# Patient Record
Sex: Female | Born: 1935 | Race: White | Hispanic: No | Smoking: Never smoker
Health system: Southern US, Community
[De-identification: ages and names within clinical notes are randomized; demographics above are authoritative.]

## PROBLEM LIST (undated history)

## (undated) DIAGNOSIS — K5090 Crohn's disease, unspecified, without complications: Secondary | ICD-10-CM

## (undated) DIAGNOSIS — H409 Unspecified glaucoma: Secondary | ICD-10-CM

## (undated) DIAGNOSIS — I10 Essential (primary) hypertension: Secondary | ICD-10-CM

## (undated) DIAGNOSIS — E785 Hyperlipidemia, unspecified: Secondary | ICD-10-CM

## (undated) DIAGNOSIS — E559 Vitamin D deficiency, unspecified: Secondary | ICD-10-CM

## (undated) DIAGNOSIS — R7303 Prediabetes: Secondary | ICD-10-CM

## (undated) HISTORY — DX: Prediabetes: R73.03

## (undated) HISTORY — DX: Crohn's disease, unspecified, without complications: K50.90

## (undated) HISTORY — DX: Essential (primary) hypertension: I10

## (undated) HISTORY — DX: Vitamin D deficiency, unspecified: E55.9

## (undated) HISTORY — PX: TONSILLECTOMY AND ADENOIDECTOMY: SUR1326

## (undated) HISTORY — DX: Unspecified glaucoma: H40.9

## (undated) HISTORY — DX: Hyperlipidemia, unspecified: E78.5

---

## 1998-07-22 ENCOUNTER — Ambulatory Visit (HOSPITAL_COMMUNITY): Admission: RE | Admit: 1998-07-22 | Discharge: 1998-07-22 | Payer: Self-pay | Admitting: *Deleted

## 1998-09-22 ENCOUNTER — Ambulatory Visit (HOSPITAL_COMMUNITY): Admission: RE | Admit: 1998-09-22 | Discharge: 1998-09-22 | Payer: Self-pay | Admitting: *Deleted

## 2006-10-13 ENCOUNTER — Ambulatory Visit: Payer: Self-pay | Admitting: Cardiovascular Disease

## 2008-12-04 ENCOUNTER — Ambulatory Visit (HOSPITAL_COMMUNITY): Admission: RE | Admit: 2008-12-04 | Discharge: 2008-12-04 | Payer: Self-pay | Admitting: Internal Medicine

## 2011-04-23 NOTE — Letter (Signed)
October 13, 2006    Courtney Fuller, D.O.  [address]  [place], [place] [address]   RE:  Courtney, Fuller  MRN:  [id number]  /  DOB:  1915-02-26   Dear Dr. Elisabeth Most:   It was my pleasure to see Courtney Fuller at the [facility] this  morning. As you know, she is a delightful 77 year old woman who has been  remarkably healthy over her life, presenting today for an evaluation of an  abnormal EKG.   Courtney Fuller has a history of essential hypertension. She has no other  cardiovascular problems in her history. She is completely asymptomatic. She  specifically denies chest pain, dyspnea, palpitations, lightheadedness,  syncope, orthopnea, PND or edema. She recently underwent her yearly physical  examination and was found to be in good health. She did have an abnormal EKG  and was sent here for evaluation of that new issue.   Past medical history is pertinent for:  1. A remote tonsillectomy.  2. Essential hypertension, treated with Altace and atenolol.  3. Dyslipidemia, treated with Crestor.  4. Crohn's disease, on Asacol.   Current medications include aspirin 81 mg daily, triamterene/HCTZ 37.5/25 mg  daily, Altace 5 mg daily, Asacol 400 mg two twice daily, atenolol 50 mg  twice daily, Crestor 5 mg daily, Fosamax 70 mg weekly, timolol drops,  Xalatan drops, flax seed oil, glucosamine and chondroitin and cinnamon  daily.   ALLERGIES:  No known drug allergies.   SOCIAL HISTORY:  The patient is widowed. 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 She worked 46 hours this week. She does not  smoke cigarettes or drink alcohol. She occasionally drinks caffeine. She  exercises five days weekly as she walks for 25 minutes.   FAMILY HISTORY:  Her mother died at 105 of a heart attack; father died at 59  of a stroke. She has a brother who died at 63 of old age.   REVIEW OF SYSTEMS:  Complete 12-point review of systems was performed. It  was  negative, except as described above.   PHYSICAL EXAMINATION:  The patient is alert and oriented. She is a healthy-  appearing, elderly woman in no acute distress. Her weight is 147 pounds,  blood pressure is 128/84, heart rate is 66, respiratory rate is 12.  EYES: Sclera anicteric. Conjunctivae pink.  ENT: Moist oral mucosa with a normal oropharynx.  NECK: Normal carotid upstrokes without bruits. Jugulovenous pressure is  normal.  LUNGS:  Are clear bilaterally.  CARDIOVASCULAR: There is no RV lift. Heart is regular rate and rhythm  without murmurs or gallops. The apex is discrete.  ABDOMEN: Soft and nontender. No organomegaly. No abdominal bruits.  EXTREMITIES: No clubbing, cyanosis or edema. Peripheral pulses are 2+ and  equal throughout.  SKIN: Is warm and dry without rash.  NEUROLOGIC: Is grossly intact with 5/5 strength in the arms and legs  bilaterally.  EKG demonstrates normal sinus rhythm with a heart rate of 66. The PR  interval is 130 milliseconds.   The previous EKG also demonstrates normal sinus rhythm with a lot of  baseline artifact. The artifact mimics atrial fibrillation.   LABORATORY DATA:  Demonstrates normal hemoglobin with 14.0; platelet count  of 299,000; potassium is 5.4, creatinine is 1.2; BUN is 29; total  cholesterol is 220; triglycerides 178; LDL is 134; HDL is 50.   ASSESSMENT:  Courtney Fuller is an 77 year old woman with essential hypertension.  Her outside electrocardiogram was  difficult to interpret secondary to  baseline artifact. Our 12-lead electrocardiogram here demonstrates normal  sinus rhythm. Comparing the two electrocardiograms, the rhythm appears  unchanged. I do not think the patient has had any atrial-fibrillation as her  heart rate is unchanged and her P-wave morphology looks the same on both  studies. The baseline artifact from her previous electrocardiogram did mimic  atrial-fibrillation/flutter making it difficult to interpret. I do not   recommend any further cardiac studies at this point. I think the patient is  really doing remarkably well and has excellent control of her  hypertension and other cardiovascular risk factors. I would be happy to see  her back on a p.r.n. basis anytime.   Dr. Elisabeth Most, thanks once again for the opportunity to evaluate Courtney Fuller.  Please feel free to contact me at any time with questions regarding her  care.    Sincerely,      Veverly Fells. Excell Seltzer, MD  Electronically Signed    MDC/MedQ  DD: 10/13/2006  DT: 10/13/2006  Job #: [id number]

## 2013-12-18 DIAGNOSIS — H409 Unspecified glaucoma: Secondary | ICD-10-CM | POA: Insufficient documentation

## 2013-12-18 DIAGNOSIS — K5090 Crohn's disease, unspecified, without complications: Secondary | ICD-10-CM | POA: Insufficient documentation

## 2013-12-18 DIAGNOSIS — R7303 Prediabetes: Secondary | ICD-10-CM | POA: Insufficient documentation

## 2013-12-18 DIAGNOSIS — E559 Vitamin D deficiency, unspecified: Secondary | ICD-10-CM | POA: Insufficient documentation

## 2013-12-18 DIAGNOSIS — I10 Essential (primary) hypertension: Secondary | ICD-10-CM | POA: Insufficient documentation

## 2013-12-18 DIAGNOSIS — E785 Hyperlipidemia, unspecified: Secondary | ICD-10-CM | POA: Insufficient documentation

## 2013-12-19 ENCOUNTER — Encounter: Payer: Self-pay | Admitting: Physician Assistant

## 2013-12-19 ENCOUNTER — Ambulatory Visit (INDEPENDENT_AMBULATORY_CARE_PROVIDER_SITE_OTHER): Payer: Medicare HMO | Admitting: Physician Assistant

## 2013-12-19 VITALS — BP 132/82 | HR 60 | Temp 98.6°F | Resp 16 | Wt 156.0 lb

## 2013-12-19 DIAGNOSIS — K5090 Crohn's disease, unspecified, without complications: Secondary | ICD-10-CM

## 2013-12-19 DIAGNOSIS — E782 Mixed hyperlipidemia: Secondary | ICD-10-CM

## 2013-12-19 DIAGNOSIS — H409 Unspecified glaucoma: Secondary | ICD-10-CM

## 2013-12-19 DIAGNOSIS — E559 Vitamin D deficiency, unspecified: Secondary | ICD-10-CM

## 2013-12-19 DIAGNOSIS — R7309 Other abnormal glucose: Secondary | ICD-10-CM

## 2013-12-19 DIAGNOSIS — Z79899 Other long term (current) drug therapy: Secondary | ICD-10-CM

## 2013-12-19 DIAGNOSIS — I10 Essential (primary) hypertension: Secondary | ICD-10-CM

## 2013-12-19 DIAGNOSIS — E785 Hyperlipidemia, unspecified: Secondary | ICD-10-CM

## 2013-12-19 DIAGNOSIS — R7303 Prediabetes: Secondary | ICD-10-CM

## 2013-12-19 LAB — BASIC METABOLIC PANEL WITH GFR
BUN: 28 mg/dL — ABNORMAL HIGH (ref 6–23)
CALCIUM: 9.5 mg/dL (ref 8.4–10.5)
CO2: 32 mEq/L (ref 19–32)
Chloride: 101 mEq/L (ref 96–112)
Creat: 1.07 mg/dL (ref 0.50–1.10)
GFR, EST AFRICAN AMERICAN: 51 mL/min — AB
GFR, EST NON AFRICAN AMERICAN: 44 mL/min — AB
Glucose, Bld: 87 mg/dL (ref 70–99)
Potassium: 5.1 mEq/L (ref 3.5–5.3)
SODIUM: 140 meq/L (ref 135–145)

## 2013-12-19 LAB — HEPATIC FUNCTION PANEL
ALT: 11 U/L (ref 0–35)
AST: 18 U/L (ref 0–37)
Albumin: 3.8 g/dL (ref 3.5–5.2)
Alkaline Phosphatase: 50 U/L (ref 39–117)
BILIRUBIN DIRECT: 0.1 mg/dL (ref 0.0–0.3)
BILIRUBIN TOTAL: 0.4 mg/dL (ref 0.3–1.2)
Indirect Bilirubin: 0.3 mg/dL (ref 0.0–0.9)
Total Protein: 7.1 g/dL (ref 6.0–8.3)

## 2013-12-19 LAB — CBC WITH DIFFERENTIAL/PLATELET
BASOS PCT: 0 % (ref 0–1)
Basophils Absolute: 0 10*3/uL (ref 0.0–0.1)
EOS ABS: 0 10*3/uL (ref 0.0–0.7)
Eosinophils Relative: 0 % (ref 0–5)
HCT: 39.7 % (ref 36.0–46.0)
Hemoglobin: 13.3 g/dL (ref 12.0–15.0)
LYMPHS ABS: 1.3 10*3/uL (ref 0.7–4.0)
Lymphocytes Relative: 14 % (ref 12–46)
MCH: 27.8 pg (ref 26.0–34.0)
MCHC: 33.5 g/dL (ref 30.0–36.0)
MCV: 83.1 fL (ref 78.0–100.0)
Monocytes Absolute: 0.8 10*3/uL (ref 0.1–1.0)
Monocytes Relative: 9 % (ref 3–12)
NEUTROS PCT: 77 % (ref 43–77)
Neutro Abs: 7.1 10*3/uL (ref 1.7–7.7)
PLATELETS: 313 10*3/uL (ref 150–400)
RBC: 4.78 MIL/uL (ref 3.87–5.11)
RDW: 14.9 % (ref 11.5–15.5)
WBC: 9.2 10*3/uL (ref 4.0–10.5)

## 2013-12-19 LAB — LIPID PANEL
CHOL/HDL RATIO: 4.8 ratio
CHOLESTEROL: 196 mg/dL (ref 0–200)
HDL: 41 mg/dL (ref >39)
LDL Cholesterol: 124 mg/dL — ABNORMAL HIGH (ref 0–99)
TRIGLYCERIDES: 155 mg/dL — AB (ref <150)
VLDL: 31 mg/dL (ref 0–40)

## 2013-12-19 LAB — HEMOGLOBIN A1C
Hgb A1c MFr Bld: 5.5 % (ref <5.7)
MEAN PLASMA GLUCOSE: 111 mg/dL (ref <117)

## 2013-12-19 LAB — MAGNESIUM: Magnesium: 1.9 mg/dL (ref 1.5–2.5)

## 2013-12-19 MED ORDER — ATENOLOL 50 MG PO TABS: 50.0000 mg | ORAL_TABLET | Freq: Three times a day (TID) | ORAL | Status: DC

## 2013-12-19 MED ORDER — MESALAMINE 1.2 G PO TBEC: 1.2000 g | DELAYED_RELEASE_TABLET | Freq: Two times a day (BID) | ORAL | Status: DC

## 2013-12-19 NOTE — Progress Notes (Signed)
Complete Physical HPI Patient presents for complete physical.   Patient's blood pressure has been controlled at home. Patient denies chest pain, shortness of breath, dizziness. BP: 132/82 mmHg  Patient's cholesterol is diet controlled. The patient's cholesterol last visit was LDL 120.  Her GFR was 39 last visit. She has been unable to sleep and has been taking aleve PM which is helping.  The patient has been working on diet and exercise for prediabetes, denies changes in vision, polys, and paresthesias. 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 Katy Fitch q 6 months and Dr. Zadie Rhine q 6 months.  She has crohn's disease and is on Lialda for this twice daily. Her last colonoscopy was in 2004 and she was in remission. Occasionally she has constipation uses supp.   Current Medications:  Current Outpatient Prescriptions on File Prior to Visit  Medication Sig Dispense Refill  . aspirin 81 MG chewable tablet Chew by mouth daily.      Marland Kitchen atenolol (TENORMIN) 50 MG tablet Take 50 mg by mouth 3 (three) times daily.      . Cholecalciferol 4000 UNITS CAPS Take 4,000 Int'l Units by mouth daily.      . LUTEIN PO Take by mouth daily.      . mesalamine (LIALDA) 1.2 G EC tablet Take 1.2 g by mouth 2 (two) times daily.      . Red Yeast Rice 600 MG CAPS Take by mouth daily.       No current facility-administered medications on file prior to visit.   Health Maintenance:  Tetanus: 2014 Pneumovax: 2004 Flu vaccine: 09/2013 Zostavax: 2007 Pap: declines MGM: declines DEXA: declines Colonoscopy: 2004- never again EGD: declines   Allergies:  Allergies  Allergen Reactions  . Ace Inhibitors    Medical History:  Past Medical History  Diagnosis Date  . Hyperlipidemia   . Hypertension   . Prediabetes   . Vitamin D deficiency   . Glaucoma   . Crohn's disease    Surgical History:  Past Surgical History  Procedure Laterality Date  . Tonsillectomy and  adenoidectomy     Family History: No family history on file. Social History:  History   Social History  . Marital Status: Widowed    Spouse Name: N/A    Number of Children: N/A  . Years of Education: N/A   Occupational History  . Not on file.   Social History Main Topics  . Smoking status: Never Smoker   . Smokeless tobacco: Not on file  . Alcohol Use: Not on file  . Drug Use: Not on file  . Sexual Activity: Not on file   Other Topics Concern  . Not on file   Social History Narrative  . No narrative on file   ROS Constitutional: Denies weight loss/gain, headaches, insomnia, fatigue, night sweats, and change in appetite. Eyes: Denies redness, blurred vision, diplopia, discharge, itchy, watery eyes.  ENT: Denies discharge, congestion, post nasal drip, sore throat, earache, hearing loss, dental pain, Tinnitus, Vertigo, Sinus pain, snoring.  Cardio: Denies chest pain, palpitations, irregular heartbeat, dyspnea, diaphoresis, orthopnea, PND, claudication, edema Respiratory: denies cough, dyspnea, pleurisy, hoarseness, wheezing.  Gastrointestinal: Olevia Perches) Denies dysphagia, heartburn, pain, cramps, nausea, vomiting, bloating, diarrhea, constipation, hematemesis, melena, hematochezia, hemorrhoids Genitourinary: Denies dysuria, frequency, urgency, nocturia, hesitancy, discharge, hematuria, flank pain Breast: Denies Breast lumps, nipple discharge, bleeding.  Musculoskeletal: Denies arthralgia, myalgia, stiffness, Jt. Swelling, pain, Skin: Denies pruritis, rash, hives,  acne, eczema, changing in skin lesion Neuro: Denies Weakness, tremor, incoordination, spasms, paresthesia, pain Psychiatric: Denies confusion, memory loss, sensory loss Endocrine: Denies change in weight, skin, hair change, nocturia, and paresthesia, Diabetic Denies Polys, visual blurring, hyper /hypo glycemic episodes.  Heme/Lymph: Denies Excessive bleeding, bruising, enlarged lymph nodes  Physical Exam: There is no  height on file to calculate BMI. Filed Vitals:   12/19/13 1406  BP: 132/82  Pulse: 60  Temp: 98.6 F (37 C)  Resp: 16   General Appearance: Well nourished, in no apparent distress. Eyes: PERRLA, EOMs, conjunctiva no swelling or erythema, normal fundi and vessels. Sinuses: No Frontal/maxillary tenderness ENT/Mouth: Ext aud canals clear, normal light reflex with TMs without erythema, bulging.  Good dentition. No erythema, swelling, or exudate on post pharynx. Tonsils not swollen or erythematous. Hearing normal.  Neck: Supple, thyroid normal. No bruits Respiratory: Respiratory effort normal, BS equal bilaterally without rales, rhonchi, wheezing or stridor. Cardio: RRR without murmurs, rubs or gallops. Brisk peripheral pulses with mild bilateral edema.  Chest: symmetric, with normal excursions and percussion. Breasts: defer Abdomen: Soft, +BS. Non tender, no guarding, rebound, hernias, masses, or organomegaly. .  Lymphatics: Non tender without lymphadenopathy.  Genitourinary: defer Musculoskeletal: Full ROM all peripheral extremities,5/5 strength, and normal gait. Skin: Warm, dry without rashes, lesions, ecchymosis. On front of forehead, she states she bumps her head, erythematous scaly area.  Neuro: Cranial nerves intact, reflexes equal bilaterally. Normal muscle tone, no cerebellar symptoms.  Psych: Awake and oriented X 3, normal affect, Insight and Judgment appropriate.   EKG: declines  Assessment and Plan: Hyperlipidemia- check level  Hypertension- continue atenolol  Prediabetes- check level  Vitamin D deficiency- continue meds  Glaucoma- continue to follow eye doctors.   Crohn's disease- decrease to once daily since in maintenance.   She is a very goo 80 year old, cognition good, and she does not want anything invasive or any extreme measures.  Due to advanced age she would also prefer if she only came in once a year unless there is something wrong.   Discussed med's effects  and SE's. Screening labs and tests as requested with regular follow-up as recommended.   Vicie Mutters 2:27 PM

## 2013-12-19 NOTE — Patient Instructions (Addendum)
Rather than do aleve night time please get acetaminophen PM/night time due to your kidney function. You can take 1-2 at night. If this does not help you please call the office.   Constipation, Adult Constipation is when a person has fewer than 3 bowel movements a week; has difficulty having a bowel movement; or has stools that are dry, hard, or larger than normal. As people grow older, constipation is more common. If you try to fix constipation with medicines that make you have a bowel movement (laxatives), the problem may get worse. Long-term laxative use may cause the muscles of the colon to become weak. A low-fiber diet, not taking in enough fluids, and taking certain medicines may make constipation worse. CAUSES   Certain medicines, such as antidepressants, pain medicine, iron supplements, antacids, and water pills.   Certain diseases, such as diabetes, irritable bowel syndrome (IBS), thyroid disease, or depression.   Not drinking enough water.   Not eating enough fiber-rich foods.   Stress or travel.  Lack of physical activity or exercise.  Not going to the restroom when there is the urge to have a bowel movement.  Ignoring the urge to have a bowel movement.  Using laxatives too much. SYMPTOMS   Having fewer than 3 bowel movements a week.   Straining to have a bowel movement.   Having hard, dry, or larger than normal stools.   Feeling full or bloated.   Pain in the lower abdomen.  Not feeling relief after having a bowel movement. DIAGNOSIS  Your caregiver will take a medical history and perform a physical exam. Further testing may be done for severe constipation. Some tests may include:   A barium enema X-ray to examine your rectum, colon, and sometimes, your small intestine.  A sigmoidoscopy to examine your lower colon.  A colonoscopy to examine your entire colon. TREATMENT  Treatment will depend on the severity of your constipation and what is causing it.  Some dietary treatments include drinking more fluids and eating more fiber-rich foods. Lifestyle treatments may include regular exercise. If these diet and lifestyle recommendations do not help, your caregiver may recommend taking over-the-counter laxative medicines to help you have bowel movements. Prescription medicines may be prescribed if over-the-counter medicines do not work.  HOME CARE INSTRUCTIONS   Increase dietary fiber in your diet, such as fruits, vegetables, whole grains, and beans. Limit high-fat and processed sugars in your diet, such as [place] fries, hamburgers, cookies, candies, and soda.   You can add magnesium 250mg  one- two times daily.   A fiber supplement may be added to your diet if you cannot get enough fiber from foods.   Drink enough fluids to keep your urine clear or pale yellow.   Exercise regularly or as directed by your caregiver.   Go to the restroom when you have the urge to go. Do not hold it.  Only take medicines as directed by your caregiver. Do not take other medicines for constipation without talking to your caregiver first. [place] IF:   You have bright red blood in your stool.   Your constipation lasts for more than 4 days or gets worse.   You have abdominal or rectal pain.   You have thin, pencil-like stools.  You have unexplained weight loss. MAKE SURE YOU:   Understand these instructions.  Will watch your condition.  Will get help right away if you are not doing well or get worse. Document Released: 08/20/2004 Document Revised: 02/14/2012  Document Reviewed: 09/03/2013 [facility] Patient Information 2014 [place].

## 2013-12-20 LAB — URINALYSIS, ROUTINE W REFLEX MICROSCOPIC
BILIRUBIN URINE: NEGATIVE
Glucose, UA: NEGATIVE mg/dL
Hgb urine dipstick: NEGATIVE
KETONES UR: NEGATIVE mg/dL
Leukocytes, UA: NEGATIVE
NITRITE: NEGATIVE
PH: 6 (ref 5.0–8.0)
Protein, ur: NEGATIVE mg/dL
SPECIFIC GRAVITY, URINE: 1.014 (ref 1.005–1.030)
Urobilinogen, UA: 0.2 mg/dL (ref 0.0–1.0)

## 2013-12-20 LAB — MICROALBUMIN / CREATININE URINE RATIO
CREATININE, URINE: 88.2 mg/dL
MICROALB/CREAT RATIO: 22 mg/g (ref 0.0–30.0)
Microalb, Ur: 1.94 mg/dL — ABNORMAL HIGH (ref 0.00–1.89)

## 2013-12-20 LAB — TSH: TSH: 4.097 u[IU]/mL (ref 0.350–4.500)

## 2014-07-09 ENCOUNTER — Other Ambulatory Visit: Payer: Self-pay | Admitting: Physician Assistant

## 2014-12-19 ENCOUNTER — Encounter: Payer: Self-pay | Admitting: Physician Assistant

## 2014-12-25 ENCOUNTER — Encounter: Payer: Self-pay | Admitting: Physician Assistant

## 2014-12-25 ENCOUNTER — Ambulatory Visit (INDEPENDENT_AMBULATORY_CARE_PROVIDER_SITE_OTHER): Payer: Medicare HMO | Admitting: Physician Assistant

## 2014-12-25 VITALS — BP 142/90 | HR 68 | Temp 98.1°F | Resp 16 | Ht 63.0 in | Wt 149.0 lb

## 2014-12-25 DIAGNOSIS — Z79899 Other long term (current) drug therapy: Secondary | ICD-10-CM | POA: Diagnosis not present

## 2014-12-25 DIAGNOSIS — N183 Chronic kidney disease, stage 3 unspecified: Secondary | ICD-10-CM | POA: Insufficient documentation

## 2014-12-25 DIAGNOSIS — E785 Hyperlipidemia, unspecified: Secondary | ICD-10-CM

## 2014-12-25 DIAGNOSIS — R7309 Other abnormal glucose: Secondary | ICD-10-CM

## 2014-12-25 DIAGNOSIS — H3530 Unspecified macular degeneration: Secondary | ICD-10-CM

## 2014-12-25 DIAGNOSIS — Z0001 Encounter for general adult medical examination with abnormal findings: Secondary | ICD-10-CM | POA: Diagnosis not present

## 2014-12-25 DIAGNOSIS — R6889 Other general symptoms and signs: Secondary | ICD-10-CM

## 2014-12-25 DIAGNOSIS — H409 Unspecified glaucoma: Secondary | ICD-10-CM

## 2014-12-25 DIAGNOSIS — I10 Essential (primary) hypertension: Secondary | ICD-10-CM | POA: Diagnosis not present

## 2014-12-25 DIAGNOSIS — K5090 Crohn's disease, unspecified, without complications: Secondary | ICD-10-CM

## 2014-12-25 DIAGNOSIS — R7303 Prediabetes: Secondary | ICD-10-CM

## 2014-12-25 DIAGNOSIS — E559 Vitamin D deficiency, unspecified: Secondary | ICD-10-CM

## 2014-12-25 LAB — CBC WITH DIFFERENTIAL/PLATELET
Basophils Absolute: 0 10*3/uL (ref 0.0–0.1)
Basophils Relative: 0 % (ref 0–1)
Eosinophils Absolute: 0 10*3/uL (ref 0.0–0.7)
Eosinophils Relative: 0 % (ref 0–5)
HEMATOCRIT: 42 % (ref 36.0–46.0)
Hemoglobin: 13.7 g/dL (ref 12.0–15.0)
LYMPHS ABS: 1.1 10*3/uL (ref 0.7–4.0)
LYMPHS PCT: 12 % (ref 12–46)
MCH: 27.8 pg (ref 26.0–34.0)
MCHC: 32.6 g/dL (ref 30.0–36.0)
MCV: 85.2 fL (ref 78.0–100.0)
MONOS PCT: 11 % (ref 3–12)
MPV: 10.2 fL (ref 8.6–12.4)
Monocytes Absolute: 1 10*3/uL (ref 0.1–1.0)
NEUTROS ABS: 7.2 10*3/uL (ref 1.7–7.7)
NEUTROS PCT: 77 % (ref 43–77)
Platelets: 345 10*3/uL (ref 150–400)
RBC: 4.93 MIL/uL (ref 3.87–5.11)
RDW: 14.8 % (ref 11.5–15.5)
WBC: 9.3 10*3/uL (ref 4.0–10.5)

## 2014-12-25 LAB — HEPATIC FUNCTION PANEL
ALBUMIN: 3.9 g/dL (ref 3.5–5.2)
ALT: 13 U/L (ref 0–35)
AST: 17 U/L (ref 0–37)
Alkaline Phosphatase: 58 U/L (ref 39–117)
BILIRUBIN TOTAL: 0.7 mg/dL (ref 0.2–1.2)
Bilirubin, Direct: 0.1 mg/dL (ref 0.0–0.3)
Indirect Bilirubin: 0.6 mg/dL (ref 0.2–1.2)
TOTAL PROTEIN: 7.7 g/dL (ref 6.0–8.3)

## 2014-12-25 LAB — BASIC METABOLIC PANEL WITH GFR
BUN: 27 mg/dL — AB (ref 6–23)
CO2: 30 mEq/L (ref 19–32)
CREATININE: 1.13 mg/dL — AB (ref 0.50–1.10)
Calcium: 9.4 mg/dL (ref 8.4–10.5)
Chloride: 101 mEq/L (ref 96–112)
GFR, EST AFRICAN AMERICAN: 47 mL/min — AB
GFR, EST NON AFRICAN AMERICAN: 41 mL/min — AB
Glucose, Bld: 99 mg/dL (ref 70–99)
Potassium: 4.9 mEq/L (ref 3.5–5.3)
Sodium: 140 mEq/L (ref 135–145)

## 2014-12-25 LAB — LIPID PANEL
CHOL/HDL RATIO: 4.8 ratio
CHOLESTEROL: 203 mg/dL — AB (ref 0–200)
HDL: 42 mg/dL (ref >39)
LDL Cholesterol: 127 mg/dL — ABNORMAL HIGH (ref 0–99)
Triglycerides: 170 mg/dL — ABNORMAL HIGH (ref <150)
VLDL: 34 mg/dL (ref 0–40)

## 2014-12-25 LAB — TSH: TSH: 5.026 u[IU]/mL — ABNORMAL HIGH (ref 0.350–4.500)

## 2014-12-25 LAB — MAGNESIUM: Magnesium: 2.1 mg/dL (ref 1.5–2.5)

## 2014-12-25 MED ORDER — MESALAMINE 1.2 G PO TBEC: 1.2000 g | DELAYED_RELEASE_TABLET | Freq: Two times a day (BID) | ORAL | Status: DC

## 2014-12-25 MED ORDER — ATENOLOL 50 MG PO TABS: ORAL_TABLET | ORAL | Status: DC

## 2014-12-25 NOTE — Patient Instructions (Addendum)
-  Please take Tylenol or Aleve for pain as needed . -You can take tylenol (500mg ) or tylenol arthritis (650mg ).  The max you can take of tylenol a day is 3000mg  daily, this is a max of 6 pills a day of the regular tyelnol (500mg ) or a max of 4 a day of the tylenol arthritis (650mg ) as long as no other medications you are taking contain tylenol.   -Aleve/Naproxen Sodium- can take 1-2 in the morning  Take with food to avoid ulcers. Does affect your kidney function so use sparingly.   Use a dropper to put olive oil or canola oil in the effected ear- 2-3 times a week. Let it soak for 20-30 min then you can take a shower or use a baby bulb with warm water to wash out the ear wax.  Do not use Qtips  Tumeric with pepper extract works well for joint pain, try this rather than the other medication 1 or 2 pills a day at the most.

## 2014-12-25 NOTE — Progress Notes (Signed)
MEDICARE ANNUAL WELLNESS VISIT AND CPE  Assessment:   1. Essential hypertension - atenolol (TENORMIN) 50 MG tablet; TAKE 1 TABLET 3 (THREE) TIMES DAILY.  Dispense: 270 tablet; Refill: 2 - CBC with Differential - BASIC METABOLIC PANEL WITH GFR - Hepatic function panel - TSH - Urinalysis, Routine w reflex microscopic - Microalbumin / creatinine urine ratio  2. Crohn's disease, without complications - mesalamine (LIALDA) 1.2 G EC tablet; Take 1 tablet (1.2 g total) by mouth 2 (two) times daily.  Dispense: 180 tablet; Refill: 2  3. Prediabetes - HM DIABETES FOOT EXAM  4. Hyperlipidemia -continue medications, check lipids, decrease fatty foods, increase activity.  - Lipid panel  5. Vitamin D deficiency Continue supplement  6. Glaucoma Continue follow up  7. Macular degeneration Continue follow up  8. CKD (chronic kidney disease) stage 3, GFR 30-59 ml/min Increase fluids, avoid NSAIDS/take sparingly, monitor sugars, will monitor  9. Medication management - Magnesium   Plan:   During the course of the visit the patient was educated and counseled about appropriate screening and preventive services including:    Pneumococcal vaccine   Influenza vaccine  Td vaccine  Screening electrocardiogram  Bone densitometry screening  Colorectal cancer screening  Diabetes screening  Glaucoma screening  Nutrition counseling   Advanced directives: requested  Screening recommendations, referrals: Vaccinations: Please see documentation below and orders this visit.  Nutrition assessed and recommended  Colonoscopy declined Recommended yearly ophthalmology/optometry visit for glaucoma screening and checkup Recommended yearly dental visit for hygiene and checkup Advanced directives - requested  Conditions/risks identified: BMI: Discussed weight loss, diet, and increase physical activity.  Increase physical activity: AHA recommends 150 minutes of physical activity a  week.  Medications reviewed Diabetes is at goal, ACE/ARB therapy: No, Reason not on Ace Inhibitor/ARB therapy:  preDM Urinary Incontinence is not an issue: discussed non pharmacology and pharmacology options.  Fall risk: moderate- discussed PT, home fall assessment, medications.    Subjective:  Courtney Fuller is a 81 y.o. female who presents for Medicare Annual Wellness Visit and complete physical.  Date of last medicare wellness visit is unknown.   Her blood pressure has been controlled at home, today their BP is BP: (!) 142/90 mmHg She does not workout. She denies chest pain, shortness of breath, dizziness.  She is not on cholesterol medication and denies myalgias. Her cholesterol is at goal. The cholesterol last visit was:   Lab Results  Component Value Date   CHOL 196 12/19/2013   HDL 41 12/19/2013   LDLCALC 124* 12/19/2013   TRIG 155* 12/19/2013   CHOLHDL 4.8 12/19/2013   She has been working on diet and exercise for prediabetes, and denies paresthesia of the feet, polydipsia, polyuria and visual disturbances. A1C was 6.0 in the 2014. Last A1C in the office was:  Lab Results  Component Value Date   HGBA1C 5.5 12/19/2013  Patient is on Vitamin D supplement.   She has CKD, last GFR 44.  She follows with Dr. Katy Fitch and Dr. Zadie Rhine for Mac [name] and [name]- started to get injections with Dr. Zadie Rhine in May of last year, she goes every 6-8 weeks for an injection. So she is not driving anymore.  She has crohn's disease and is on lialda for this and has done well on this for many years and would like to stay on this.  No aches and pain except her feet, she will use aleve for this occ.  Currently lives at [facility] and she would like to  be DNR.    Names of Other Physician/Practitioners you currently use: 1. [facility] here for primary care 2. Dr. Katy Fitch, eye doctor, q 6 months 3. Dr. Zadie Rhine q 6 months [name], MD as PCP  - General (Internal Medicine)   Medication Review: Current Outpatient Prescriptions on File Prior to Visit  Medication Sig Dispense Refill  . aspirin 81 MG chewable tablet Chew by mouth daily.    Marland Kitchen atenolol (TENORMIN) 50 MG tablet TAKE 1 TABLET 3 (THREE) TIMES DAILY. 270 tablet 2  . Cholecalciferol 4000 UNITS CAPS Take 4,000 Int'l Units by mouth daily.    Marland Kitchen LIALDA 1.2 G EC tablet TAKE 1 TABLET  BY MOUTH 2 (TWO) TIMES DAILY. 180 tablet 2  . LUTEIN PO Take by mouth daily.    . Red Yeast Rice 600 MG CAPS Take by mouth daily.     No current facility-administered medications on file prior to visit.    Current Problems (verified) Patient Active Problem List   Diagnosis Date Noted  . Hyperlipidemia   . Hypertension   . Prediabetes   . Vitamin D deficiency   . Glaucoma   . Crohn's disease    Screening Tests Immunization History  Administered Date(s) Administered  . Influenza-Unspecified 09/18/2013  . Pneumococcal Polysaccharide-23 09/04/2003  . Tdap 12/13/2012  . Zoster 09/28/2006   Preventative care: Tetanus: 2014 declines another Pneumovax: 2004 declines another Prevnar 13: declines Flu vaccine: 09/2013 Zostavax: 2007 Pap: declines MGM: declines DEXA: declines Colonoscopy: 2004- declines another due to age EGD: declines  CXR: 2009    Medication List       This list is accurate as of: 12/25/14  2:16 PM.  Always use your most recent med list.               aspirin 81 MG chewable tablet  Chew by mouth daily.     atenolol 50 MG tablet  Commonly known as:  TENORMIN  TAKE 1 TABLET 3 (THREE) TIMES DAILY.     Cholecalciferol 4000 UNITS Caps  Take 4,000 Int'l Units by mouth daily.     LIALDA 1.2 G EC tablet  Generic drug:  mesalamine  TAKE 1 TABLET  BY MOUTH 2 (TWO) TIMES DAILY.     LUTEIN PO  Take by mouth daily.     Red Yeast Rice 600 MG Caps  Take by mouth daily.        Past Surgical History  Procedure Laterality Date  . Tonsillectomy and  adenoidectomy     No family history on file.  History reviewed: allergies, current medications, past family history, past medical history, past social history, past surgical history and problem list   Risk Factors: Osteoporosis/FallRisk: postmenopausal estrogen deficiency and dietary calcium and/or vitamin D deficiency In the past year have you fallen or had a near fall?:No History of fracture in the past year: no  Tobacco History  Substance Use Topics  . Smoking status: Never Smoker   . Smokeless tobacco: Not on file  . Alcohol Use: Not on file   She does not smoke.  Patient is not a former smoker. Are there smokers in your home (other than you)?  No  Alcohol Current alcohol use: none  Caffeine Current caffeine use: denies use  Exercise Current exercise: none  Nutrition/Diet Current diet: in general, a "healthy" diet    Cardiac risk factors: advanced age (older than 89 for men, 56 for women), dyslipidemia and hypertension.  Depression Screen (Note: if answer to either of the following is "Yes", a more complete depression screening is indicated)   Q1: Over the past two weeks, have you felt down, depressed or hopeless? No  Q2: Over the past two weeks, have you felt little interest or pleasure in doing things? No  Have you lost interest or pleasure in daily life? No  Do you often feel hopeless? No  Do you cry easily over simple problems? No  Activities of Daily Living In your present state of health, do you have any difficulty performing the following activities?:  Driving? Yes Managing money?  No Feeding yourself? No Getting from bed to chair? No Climbing a flight of stairs? No Preparing food and eating?: No Bathing or showering? No Getting dressed: No Getting to the toilet? No Using the toilet:No Moving around from place to place: No In the past year have you fallen or had a near fall?:No   Are you sexually active?  No  Do you have more than one partner?   No  Vision Difficulties: Yes  Hearing Difficulties: No Do you often ask people to speak up or repeat themselves? No Do you experience ringing or noises in your ears? No Do you have difficulty understanding soft or whispered voices? No  Cognition  Do you feel that you have a problem with memory?No  Do you often misplace items? No  Do you feel safe at home?  Yes  Advanced directives Does patient have a [place]? Yes Does patient have a Living Will? Yes   Objective:     Blood pressure 142/90, pulse 68, temperature 98.1 F (36.7 C), resp. rate 16, weight 149 lb (67.586 kg). There is no height on file to calculate BMI.  General appearance: alert, no distress, WD/WN, female Cognitive Testing  Alert? Yes  Normal Appearance?Yes  Oriented to person? Yes  Place? Yes   Time? Yes  Recall of three objects?  Yes  Can perform simple calculations? Yes  Displays appropriate judgment?Yes  Can read the correct time from a watch face?Yes  HEENT: normocephalic, sclerae anicteric, TMs pearly, nares patent, no discharge or erythema, pharynx normal Oral cavity: MMM, no lesions Neck: supple, no lymphadenopathy, no thyromegaly, no masses Heart: RRR, normal S1, S2, no murmurs Lungs: CTA bilaterally, no wheezes, rhonchi, or rales Abdomen: +bs, soft, non tender, non distended, no masses, no hepatomegaly, no splenomegaly Musculoskeletal: nontender, no swelling, no obvious deformity Extremities: no edema, no cyanosis, no clubbing Pulses: 2+ symmetric, upper and lower extremities, normal cap refill Neurological: alert, oriented x 3, CN2-12 intact, strength normal upper extremities and lower extremities, sensation normal throughout, DTRs 2+ throughout, no cerebellar signs, gait normal Psychiatric: normal affect, behavior normal, pleasant   Medicare Attestation I have personally reviewed: The patient's medical and social history Their use of alcohol, tobacco or illicit  drugs Their current medications and supplements The patient's functional ability including ADLs,fall risks, home safety risks, cognitive, and hearing and visual impairment Diet and physical activities Evidence for depression or mood disorders  The patient's weight, height, BMI, and visual acuity have been recorded in the chart.  I have made referrals, counseling, and provided education to the patient based on review of the above and I have provided the patient with a written personalized care plan for preventive services.     Vicie Mutters, PA-C   12/25/2014

## 2014-12-26 LAB — URINALYSIS, ROUTINE W REFLEX MICROSCOPIC
BILIRUBIN URINE: NEGATIVE
Glucose, UA: NEGATIVE mg/dL
HGB URINE DIPSTICK: NEGATIVE
KETONES UR: NEGATIVE mg/dL
Leukocytes, UA: NEGATIVE
NITRITE: NEGATIVE
Protein, ur: 30 mg/dL — AB
Specific Gravity, Urine: 1.02 (ref 1.005–1.030)
UROBILINOGEN UA: 0.2 mg/dL (ref 0.0–1.0)
pH: 7 (ref 5.0–8.0)

## 2014-12-26 LAB — MICROALBUMIN / CREATININE URINE RATIO
CREATININE, URINE: 132.4 mg/dL
MICROALB UR: 7.2 mg/dL — AB (ref <2.0)
MICROALB/CREAT RATIO: 54.4 mg/g — AB (ref 0.0–30.0)

## 2014-12-26 LAB — URINALYSIS, MICROSCOPIC ONLY
Bacteria, UA: NONE SEEN
Casts: NONE SEEN
Crystals: NONE SEEN

## 2015-01-02 ENCOUNTER — Other Ambulatory Visit: Payer: Self-pay

## 2015-01-02 MED ORDER — MESALAMINE 800 MG PO TBEC: 800.0000 mg | DELAYED_RELEASE_TABLET | Freq: Two times a day (BID) | ORAL | Status: DC

## 2015-01-07 ENCOUNTER — Ambulatory Visit (INDEPENDENT_AMBULATORY_CARE_PROVIDER_SITE_OTHER): Payer: Medicare HMO | Admitting: Internal Medicine

## 2015-01-07 ENCOUNTER — Encounter: Payer: Self-pay | Admitting: Internal Medicine

## 2015-01-07 VITALS — BP 134/96 | HR 64 | Temp 98.1°F | Resp 16 | Ht 63.0 in | Wt 149.0 lb

## 2015-01-07 DIAGNOSIS — J0410 Acute tracheitis without obstruction: Secondary | ICD-10-CM

## 2015-01-07 DIAGNOSIS — J4521 Mild intermittent asthma with (acute) exacerbation: Secondary | ICD-10-CM

## 2015-01-07 MED ORDER — PREDNISONE 20 MG PO TABS: ORAL_TABLET | ORAL | Status: DC

## 2015-01-07 MED ORDER — BENZONATATE 200 MG PO CAPS: 200.0000 mg | ORAL_CAPSULE | Freq: Three times a day (TID) | ORAL | Status: DC | PRN

## 2015-01-07 MED ORDER — AZITHROMYCIN 250 MG PO TABS: ORAL_TABLET | ORAL | Status: DC

## 2015-01-07 NOTE — Progress Notes (Signed)
   Subjective:    Patient ID: Courtney Fuller, female    DOB: Feb 11, 1915, 81 y.o.   MRN: [id number]  HPI Very nice 81 y.o. WWF from [place] presenting with a 1 week hx/o a a non productive , but wheezy cough. Denies fever/chills/dyspnea.   Medication Sig  . aspirin 81 MG chewable tablet Chew by mouth daily.  Marland Kitchen atenolol (TENORMIN) 50 MG tablet TAKE 1 TABLET 3 (THREE) TIMES DAILY.  Marland Kitchen Cholecalciferol 4000 UNITS CAPS Take 4,000 Int'l Units by mouth daily.  . LUTEIN PO Take by mouth daily.  . Mesalamine 800 MG TBEC Take 1 tablet (800 mg total) by mouth 2 (two) times daily.  . Red Yeast Rice 600 MG CAPS Take by mouth daily.   Allergies  Allergen Reactions  . Ace Inhibitors    Past Medical History  Diagnosis Date  . Hyperlipidemia   . Hypertension   . Prediabetes   . Vitamin D deficiency   . Glaucoma   . Crohn's disease    Review of Systems    Objective:   Physical Exam  BP 134/96   Pulse 64  Temp 98.1 F   Resp 16  Ht 5\' 3"    Wt 149 lb      BMI 26.40    Congested wheezy cough - no respiratory distress  HEENT - Eac's patent. TM's Nl. EOM's full. PERRLA. NasoOroPharynx clear. Neck - supple. Nl Thyroid. Carotids 2+ & No bruits, nodes, JVD Chest - Clear equal BS w/ scattered  Rales and exp wheezes, but no rhonchi. Cor - Nl HS. RRR w/o sig MGR. PP 1(+). No edema. MS- FROM w/o deformities. Muscle power, tone and bulk Nl. Gait Nl. Neuro - No obvious Cr N abnormalities. Sensory, motor and Cerebellar functions appear Nl w/o focal abnormalities. Psyche - Mental status normal & appropriate.  No delusions, ideations or obvious mood abnormalities.  O2 Sat measured 96-98% at rest.    Assessment & Plan:   1. Tracheitis   2. Asthma with acute exacerbation, mild intermittent   - Rx Zpak & 1 rf, Prednisone 20 mg #20 - pulse/taper & Tessalon 200 mg # 30 x 1 rf

## 2015-01-14 ENCOUNTER — Other Ambulatory Visit: Payer: Self-pay | Admitting: *Deleted

## 2015-01-14 DIAGNOSIS — I10 Essential (primary) hypertension: Secondary | ICD-10-CM

## 2015-01-14 MED ORDER — ATENOLOL 50 MG PO TABS: ORAL_TABLET | ORAL | Status: DC

## 2015-01-15 ENCOUNTER — Telehealth: Payer: Self-pay

## 2015-01-15 NOTE — Telephone Encounter (Signed)
Spoke with Oretha Milch, patients relative and care giver. Patients insurance has denied Asacol and she does not want to continue on the Lialda she states that it does not help her. She was given Pentasa samples at last visit , per [facility], patient seems to be responding to the medication and states she feels better on this medication. Advised Sheri that I placed more samples for her at front desk and that we may at some point have to try one of the generic alternatives per insurance company. Balsalazide Disodium Capsule, SulfaSalazine DR tab, or SulfaSalazine Tab

## 2015-01-29 ENCOUNTER — Other Ambulatory Visit: Payer: Self-pay

## 2015-01-29 MED ORDER — BALSALAZIDE DISODIUM 750 MG PO CAPS: ORAL_CAPSULE | ORAL | Status: DC

## 2015-01-29 MED ORDER — BALSALAZIDE DISODIUM 750 MG PO CAPS
2250.0000 mg | ORAL_CAPSULE | Freq: Three times a day (TID) | ORAL | Status: DC
Start: 2015-01-29 — End: 2015-01-29

## 2015-02-03 DIAGNOSIS — H3532 Exudative age-related macular degeneration: Secondary | ICD-10-CM | POA: Diagnosis not present

## 2015-02-03 DIAGNOSIS — H35052 Retinal neovascularization, unspecified, left eye: Secondary | ICD-10-CM | POA: Diagnosis not present

## 2015-04-14 DIAGNOSIS — H3532 Exudative age-related macular degeneration: Secondary | ICD-10-CM | POA: Diagnosis not present

## 2015-04-14 DIAGNOSIS — H3531 Nonexudative age-related macular degeneration: Secondary | ICD-10-CM | POA: Diagnosis not present

## 2015-05-30 ENCOUNTER — Other Ambulatory Visit: Payer: Self-pay

## 2015-06-02 ENCOUNTER — Encounter: Payer: Self-pay | Admitting: Physician Assistant

## 2015-06-10 ENCOUNTER — Other Ambulatory Visit: Payer: Self-pay

## 2015-06-10 MED ORDER — MESALAMINE 800 MG PO TBEC: 800.0000 mg | DELAYED_RELEASE_TABLET | Freq: Two times a day (BID) | ORAL | Status: DC

## 2015-06-24 ENCOUNTER — Other Ambulatory Visit: Payer: Self-pay | Admitting: Internal Medicine

## 2015-07-03 ENCOUNTER — Ambulatory Visit (INDEPENDENT_AMBULATORY_CARE_PROVIDER_SITE_OTHER): Payer: Commercial Managed Care - HMO | Admitting: Physician Assistant

## 2015-07-03 ENCOUNTER — Encounter: Payer: Self-pay | Admitting: Physician Assistant

## 2015-07-03 VITALS — BP 142/96 | HR 68 | Temp 98.2°F | Resp 18 | Ht 63.0 in | Wt 149.0 lb

## 2015-07-03 DIAGNOSIS — M79604 Pain in right leg: Secondary | ICD-10-CM | POA: Diagnosis not present

## 2015-07-03 DIAGNOSIS — Z0000 Encounter for general adult medical examination without abnormal findings: Secondary | ICD-10-CM | POA: Insufficient documentation

## 2015-07-03 MED ORDER — MELOXICAM 7.5 MG PO TABS: ORAL_TABLET | ORAL | Status: DC

## 2015-07-03 NOTE — Patient Instructions (Signed)
Medial Head Gastrocnemius STRAIN Medial head gastrocnemius tear, also called tennis leg, is a tear (strain) in a muscle or tendon of the inner portion (medial head) of one of the calf muscles (gastrocnemius). The inner portion of the calf muscle attaches to the thigh bone (femur) and is responsible for bending the knee and straightening the foot (standing "on tiptoe"). Strains are classified into three categories. Grade 1 strains cause pain, but the tendon is not lengthened. Grade 2 strains include a lengthened ligament, due to the ligament being stretched or partially ruptured. With grade 2 strains there is still function, although function may be decreased. Grade 3 strains involve a complete tear of the tendon or muscle, and function is usually impaired. SYMPTOMS   Sudden "pop" or tear felt at the time of injury.  Pain, tenderness, swelling, warmth, or redness over the middle inner calf.  Pain and weakness with ankle motion, especially flexing the ankle against resistance, as well as pain with lifting up the foot (extending the ankle).  Bruising (contusion) of the calf, heel, and sometimes the foot within 48 hours of injury.  Muscle spasm in the calf. CAUSES  Muscle and ligament strains occur when a force is placed on the muscle or ligament that is greater than it can handle. Common causes of injury include:  Direct hit (trauma) to the calf.  Sudden forceful pushing off or landing on the foot (jumping, landing, serving a tennis ball, lunging). PREVENTION  Warm up and stretch properly before activity.  Allow for adequate recovery between workouts.  Maintain physical fitness:  Strength, flexibility, and endurance.  Cardiovascular fitness.  Learn and use proper exercise technique.  Complete rehabilitation after lower limb injury, before returning to competition or practice. PROGNOSIS  If treated properly, tennis leg usually heals within 6 weeks of nonsurgical treatment.  RELATED  COMPLICATIONS   Longer healing time, if not properly treated or if not given enough time to heal.  Recurring symptoms and injury, if activity is resumed too soon, with overuse, with a direct blow, or with poor technique.  If untreated, may progress to a complete tear (rare) or other injury, due to limping and favoring of the injured leg.  Persistent limping, due to scarring and shortening of the calf muscles, as a result of inadequate rehabilitation.  Prolonged disability. TREATMENT  Treatment first involves the use of ice and medication to help reduce pain and inflammation. The use of strengthening and stretching exercises may help reduce pain with activity. These exercises may be performed at home or with a therapist. For severe injuries, referral to a therapist may be needed for further evaluation and treatment. Your caregiver may advise that you wear a brace to help healing. Sometimes, crutches are needed until you can walk without limping. Rarely, surgery is needed.  MEDICATION   If pain medicine is needed, nonsteroidal anti-inflammatory medicines (aspirin and ibuprofen), or other minor pain relievers (acetaminophen), are often advised.  Do not take pain medicine for 7 days before surgery.  Prescription pain relievers may be given, if your caregiver thinks they are needed. Use only as directed and only as much as you need. HEAT AND COLD  Cold treatment (icing) should be applied for 10 to 15 minutes every 2 to 3 hours for inflammation and pain, and immediately after activity that aggravates your symptoms. Use ice packs or an ice massage.  Heat treatment may be used before performing stretching and strengthening activities prescribed by your caregiver, physical therapist, or athletic trainer. Use  a heat pack or a warm water soak. SEEK MEDICAL CARE IF:   Symptoms get worse or do not improve in 2 weeks, despite treatment.  Numbness or tingling develops.  New, unexplained symptoms  develop. (Drugs used in treatment may produce side effects.) EXERCISES  RANGE OF MOTION (ROM) AND STRETCHING EXERCISES - Medial Head Gastrocnemius Tear (Tennis Leg) These exercises may help you when beginning to rehabilitate your injury. Your symptoms may resolve with or without further involvement from your physician, physical therapist, or athletic trainer. While completing these exercises, remember:   Restoring tissue flexibility helps normal motion to return to the joints. This allows healthier, less painful movement and activity.  An effective stretch should be held for at least 30 seconds.  A stretch should never be painful. You should only feel a gentle lengthening or release in the stretched tissue. STRETCH - Gastrocsoleus  Sit with your right / left leg extended. Holding onto both ends of a belt or towel, loop it around the ball of your foot.  Keeping your right / left ankle and foot relaxed and your knee straight, pull your foot and ankle toward you using the belt.  You should feel a gentle stretch behind your calf or knee. Hold this position for __________ seconds. Repeat __________ times. Complete this stretch __________ times per day.  RANGE OF MOTION - Ankle Dorsiflexion, Active Assisted   Remove your shoes and sit on a chair, preferably not on a carpeted surface.  Place your right / left foot directly under the knee. Extend your opposite leg for support.  Keeping your heel down, slide your right / left foot back toward the chair, until you feel a stretch at your ankle or calf. If you do not feel a stretch, slide your bottom forward to the edge of the chair, while still keeping your heel down.  Hold this stretch for __________ seconds. Repeat __________ times. Complete this stretch __________ times per day.  STRETCH - Gastroc, Standing   Place your hands on a wall.  Extend your right / left leg behind you, keeping the front knee somewhat bent.  Slightly point your toes  inward on your back foot.  Keeping your right / left heel on the floor and your knee straight, shift your weight toward the wall, not allowing your back to arch.  You should feel a gentle stretch in the right / left calf. Hold this position for __________ seconds. Repeat __________ times. Complete this stretch __________ times per day. STRETCH - Soleus, Standing   Place your hands on a wall.  Extend your right / left leg behind you, keeping the other knee somewhat bent.  Point your toes of your back foot slightly inward.  Keep your right / left heel on the floor, bend your back knee, and slightly shift your weight over the back leg so that you feel a gentle stretch deep in your back calf.  Hold this position for __________ seconds. Repeat __________ times. Complete this stretch __________ times per day. STRETCH - Gastrocsoleus, Standing Note: This exercise can place a lot of stress on your foot and ankle. Please complete this exercise only if specifically instructed by your caregiver.   Place the ball of your right / left foot on a step, keeping your other foot firmly on the same step.  Hold on to the wall or a rail for balance.  Slowly lift your other foot, allowing your body weight to press your heel down over the edge of  the step.  You should feel a stretch in your right / left calf.  Hold this position for __________ seconds.  Repeat this exercise with a slight bend in your right / left knee. Repeat __________ times. Complete this stretch __________ times per day.

## 2015-07-03 NOTE — Progress Notes (Signed)
   Subjective:    Patient ID: Courtney Fuller, female    DOB: 28-Mar-1915, 81 y.o.   MRN: [id number]  HPI 81 y.o. WF with history of HTN, chol, preDM, vit D, crohn's disease presents with right leg pain x 3 weeks. She states she did trip in April and had pain for 3 weeks on her right side, this got better. Then after the 4th of July she was just sitting down and started to have right leg pain. She only has pain with walking/taking the stairs, pain is better with sitting, it does not wake her up. Feels like her leg is constricted/painful posterior leg before he knee. She has not swelling, no warmth, no redness. Denies numbness/tingling/ funny sensations. Took two aleve that helped some.  Blood pressure 142/96, pulse 68, temperature 98.2 F (36.8 C), temperature source Temporal, resp. rate 18, height 5\' 3"  (1.6 m), weight 149 lb (67.586 kg).  Past Medical History  Diagnosis Date  . Hyperlipidemia   . Hypertension   . Prediabetes   . Vitamin D deficiency   . Glaucoma   . Crohn's disease    Current Outpatient Prescriptions on File Prior to Visit  Medication Sig Dispense Refill  . aspirin 81 MG chewable tablet Chew by mouth daily.    Marland Kitchen atenolol (TENORMIN) 50 MG tablet TAKE 1 TABLET 3 TIMES A DAY. 270 tablet 0  . Cholecalciferol 4000 UNITS CAPS Take 4,000 Int'l Units by mouth daily.    Marland Kitchen latanoprost (XALATAN) 0.005 % ophthalmic solution     . LUTEIN PO Take by mouth daily.    . Mesalamine (ASACOL HD) 800 MG TBEC Take 1 tablet (800 mg total) by mouth 2 (two) times daily. 60 tablet PRN  . Red Yeast Rice 600 MG CAPS Take by mouth daily.    . balsalazide (COLAZAL) 750 MG capsule Take one tablet three times daily 90 capsule 3   No current facility-administered medications on file prior to visit.   Review of Systems  Constitutional: Negative.   HENT: Negative.   Respiratory: Negative.   Cardiovascular: Negative.   Gastrointestinal: Negative.   Genitourinary: Negative.   Musculoskeletal:  Positive for myalgias and gait problem. Negative for back pain, joint swelling, arthralgias, neck pain and neck stiffness.  Neurological: Negative.   Hematological: Negative.   Psychiatric/Behavioral: Negative.        Objective:   Physical Exam  Constitutional: She is oriented to person, place, and time. She appears well-developed and well-nourished. No distress.  HENT:  Head: Normocephalic and atraumatic.  Eyes: Conjunctivae are normal. Pupils are equal, round, and reactive to light.  Neck: Normal range of motion. Neck supple.  Cardiovascular: Normal rate and regular rhythm.   No murmur heard. Pulmonary/Chest: Effort normal and breath sounds normal. She has no wheezes.  Abdominal: Soft. Bowel sounds are normal. There is no tenderness.  Musculoskeletal:  Right leg without edema, warmth, erythema. Full ROM of right hip/knee without pain. Slightly antalgic gait. + tenderness with ankle flexion and + tenderness to gastrocnemius to palpation, no palpable cord.    Neurological: She is alert and oriented to person, place, and time. She has normal reflexes.  Skin: Skin is warm and dry. No rash noted.       Assessment & Plan:  Right leg pain, very muscular- negative straight leg- no redness/swelling, low blood clot risk Given flector patches to use Will give low dose mobic 7.5 to take BID with food.

## 2015-07-08 ENCOUNTER — Telehealth: Payer: Self-pay | Admitting: Physician Assistant

## 2015-07-08 NOTE — Telephone Encounter (Signed)
Patient requesting to try Tramadol before being referred to Ortho. Called Tramadol into [facility].

## 2015-07-08 NOTE — Telephone Encounter (Signed)
Patient calling stating that mobic 7.5mg  BID is not helping right leg pain. Will inform her that she can not take 3 pills a day of the mobic due to stomach/bleeding issues. Can offer tramadol for acute pain and can refer to PT/ortho if needed which can help.

## 2015-07-14 ENCOUNTER — Telehealth: Payer: Self-pay | Admitting: Internal Medicine

## 2015-07-14 NOTE — Telephone Encounter (Signed)
Care Giver scheduled patient to see Dr Erlinda Hong, she requested Ins referral to Dr Eduard Roux, [facility] for right leg pain. Referral # [id number] x 6 exp 01-10-16, rt leg pain [phone]: from most recent office note with Vicie Mutters.   Thank you, Leonie Douglas Referral Coordinator  [facility] [phone] Fax [phone]

## 2015-07-15 DIAGNOSIS — M79604 Pain in right leg: Secondary | ICD-10-CM | POA: Diagnosis not present

## 2015-07-15 DIAGNOSIS — M545 Low back pain: Secondary | ICD-10-CM | POA: Diagnosis not present

## 2015-07-24 ENCOUNTER — Encounter (HOSPITAL_COMMUNITY): Payer: Self-pay | Admitting: Emergency Medicine

## 2015-07-24 ENCOUNTER — Emergency Department (HOSPITAL_COMMUNITY): Payer: Commercial Managed Care - HMO

## 2015-07-24 ENCOUNTER — Emergency Department (HOSPITAL_COMMUNITY)
Admission: EM | Admit: 2015-07-24 | Discharge: 2015-07-24 | Disposition: A | Discharge status: Home / Self Care | Payer: Commercial Managed Care - HMO | Attending: Emergency Medicine | Admitting: Emergency Medicine

## 2015-07-24 DIAGNOSIS — R030 Elevated blood-pressure reading, without diagnosis of hypertension: Secondary | ICD-10-CM | POA: Diagnosis not present

## 2015-07-24 DIAGNOSIS — Z79899 Other long term (current) drug therapy: Secondary | ICD-10-CM | POA: Diagnosis not present

## 2015-07-24 DIAGNOSIS — H409 Unspecified glaucoma: Secondary | ICD-10-CM | POA: Insufficient documentation

## 2015-07-24 DIAGNOSIS — R4182 Altered mental status, unspecified: Secondary | ICD-10-CM | POA: Insufficient documentation

## 2015-07-24 DIAGNOSIS — Z8719 Personal history of other diseases of the digestive system: Secondary | ICD-10-CM | POA: Insufficient documentation

## 2015-07-24 DIAGNOSIS — Z8639 Personal history of other endocrine, nutritional and metabolic disease: Secondary | ICD-10-CM | POA: Diagnosis not present

## 2015-07-24 DIAGNOSIS — Z7982 Long term (current) use of aspirin: Secondary | ICD-10-CM | POA: Insufficient documentation

## 2015-07-24 DIAGNOSIS — R42 Dizziness and giddiness: Secondary | ICD-10-CM

## 2015-07-24 DIAGNOSIS — S2231XD Fracture of one rib, right side, subsequent encounter for fracture with routine healing: Secondary | ICD-10-CM | POA: Diagnosis not present

## 2015-07-24 DIAGNOSIS — I10 Essential (primary) hypertension: Secondary | ICD-10-CM | POA: Diagnosis not present

## 2015-07-24 LAB — URINALYSIS, ROUTINE W REFLEX MICROSCOPIC
BILIRUBIN URINE: NEGATIVE
Glucose, UA: NEGATIVE mg/dL
HGB URINE DIPSTICK: NEGATIVE
KETONES UR: NEGATIVE mg/dL
Leukocytes, UA: NEGATIVE
NITRITE: NEGATIVE
PROTEIN: NEGATIVE mg/dL
SPECIFIC GRAVITY, URINE: 1.009 (ref 1.005–1.030)
UROBILINOGEN UA: 0.2 mg/dL (ref 0.0–1.0)
pH: 7 (ref 5.0–8.0)

## 2015-07-24 LAB — CBC
HEMATOCRIT: 45.5 % (ref 36.0–46.0)
HEMOGLOBIN: 15.4 g/dL — AB (ref 12.0–15.0)
MCH: 27.5 pg (ref 26.0–34.0)
MCHC: 33.8 g/dL (ref 30.0–36.0)
MCV: 81.3 fL (ref 78.0–100.0)
Platelets: 347 10*3/uL (ref 150–400)
RBC: 5.6 MIL/uL — ABNORMAL HIGH (ref 3.87–5.11)
RDW: 15.2 % (ref 11.5–15.5)
WBC: 10.1 10*3/uL (ref 4.0–10.5)

## 2015-07-24 LAB — BASIC METABOLIC PANEL
ANION GAP: 10 (ref 5–15)
BUN: 20 mg/dL (ref 6–20)
CALCIUM: 9.3 mg/dL (ref 8.9–10.3)
CO2: 29 mmol/L (ref 22–32)
CREATININE: 0.98 mg/dL (ref 0.44–1.00)
Chloride: 94 mmol/L — ABNORMAL LOW (ref 101–111)
GFR calc non Af Amer: 46 mL/min — ABNORMAL LOW (ref >60)
GFR, EST AFRICAN AMERICAN: 54 mL/min — AB (ref >60)
Glucose, Bld: 129 mg/dL — ABNORMAL HIGH (ref 65–99)
Potassium: 3.5 mmol/L (ref 3.5–5.1)
SODIUM: 133 mmol/L — AB (ref 135–145)

## 2015-07-24 MED ORDER — HYDRALAZINE HCL 20 MG/ML IJ SOLN
10.0000 mg | INTRAMUSCULAR | Status: AC
  Administered 2015-07-24: 10 mg via INTRAVENOUS
  Filled 2015-07-24: qty 1

## 2015-07-24 MED ORDER — ATENOLOL 50 MG PO TABS
50.0000 mg | ORAL_TABLET | Freq: Once | ORAL | Status: AC
  Administered 2015-07-24: 50 mg via ORAL
  Filled 2015-07-24: qty 1

## 2015-07-24 MED ORDER — SODIUM CHLORIDE 0.9 % IV BOLUS (SEPSIS)
1000.0000 mL | Freq: Once | INTRAVENOUS | Status: AC
  Administered 2015-07-24: 1000 mL via INTRAVENOUS

## 2015-07-24 NOTE — Discharge Instructions (Signed)
Please follow up with your primary care physician in 1-2 days. If you do not have one please call the [facility] number listed above. Please take your blood pressure medication as prescribed. Please read all discharge instructions and return precautions.    Dizziness Dizziness is a common problem. It is a feeling of unsteadiness or light-headedness. You may feel like you are about to faint. Dizziness can lead to injury if you stumble or fall. A person of any age group can suffer from dizziness, but dizziness is more common in older adults. CAUSES  Dizziness can be caused by many different things, including:  Middle ear problems.  Standing for too long.  Infections.  An allergic reaction.  Aging.  An emotional response to something, such as the sight of blood.  Side effects of medicines.  Tiredness.  Problems with circulation or blood pressure.  Excessive use of alcohol or medicines, or illegal drug use.  Breathing too fast (hyperventilation).  An irregular heart rhythm (arrhythmia).  A low red blood cell count (anemia).  Pregnancy.  Vomiting, diarrhea, fever, or other illnesses that cause body fluid loss (dehydration).  Diseases or conditions such as Parkinson's disease, high blood pressure (hypertension), diabetes, and thyroid problems.  Exposure to extreme heat. DIAGNOSIS  Your health care provider will ask about your symptoms, perform a physical exam, and perform an electrocardiogram (ECG) to record the electrical activity of your heart. Your health care provider may also perform other heart or blood tests to determine the cause of your dizziness. These may include:  Transthoracic echocardiogram (TTE). During echocardiography, sound waves are used to evaluate how blood flows through your heart.  Transesophageal echocardiogram (TEE).  Cardiac monitoring. This allows your health care provider to monitor your heart rate and rhythm in real  time.  Holter monitor. This is a portable device that records your heartbeat and can help diagnose heart arrhythmias. It allows your health care provider to track your heart activity for several days if needed.  Stress tests by exercise or by giving medicine that makes the heart beat faster. TREATMENT  Treatment of dizziness depends on the cause of your symptoms and can vary greatly. HOME CARE INSTRUCTIONS   Drink enough fluids to keep your urine clear or pale yellow. This is especially important in very hot weather. In older adults, it is also important in cold weather.  Take your medicine exactly as directed if your dizziness is caused by medicines. When taking blood pressure medicines, it is especially important to get up slowly.  Rise slowly from chairs and steady yourself until you feel okay.  In the morning, first sit up on the side of the bed. When you feel okay, stand slowly while holding onto something until you know your balance is fine.  Move your legs often if you need to stand in one place for a long time. Tighten and relax your muscles in your legs while standing.  Have someone stay with you for 1-2 days if dizziness continues to be a problem. Do this until you feel you are well enough to stay alone. Have the person call your health care provider if he or she notices changes in you that are concerning.  Do not drive or use heavy machinery if you feel dizzy.  Do not drink alcohol. SEEK IMMEDIATE MEDICAL CARE IF:   Your dizziness or light-headedness gets worse.  You feel nauseous or vomit.  You have problems talking, walking, or using your arms, hands, or  legs.  You feel weak.  You are not thinking clearly or you have trouble forming sentences. It may take a friend or family member to notice this.  You have chest pain, abdominal pain, shortness of breath, or sweating.  Your vision changes.  You notice any bleeding.  You have side effects from medicine that seems  to be getting worse rather than better. MAKE SURE YOU:   Understand these instructions.  Will watch your condition.  Will get help right away if you are not doing well or get worse. Document Released: 05/18/2001 Document Revised: 11/27/2013 Document Reviewed: 06/11/2011 [facility] Patient Information 2015 [place], [place]. This information is not intended to replace advice given to you by your health care provider. Make sure you discuss any questions you have with your health care provider.

## 2015-07-24 NOTE — ED Notes (Signed)
Patient transported to MRI 

## 2015-07-24 NOTE — ED Notes (Signed)
Pt returned from CT and xray, monitor reapplied

## 2015-07-24 NOTE — ED Notes (Signed)
Per EMS: called out for altered mental status, however patient is alert and oriented.  From an independent living center, staff concerned bc pt has been noncompliant with medications for past 2 weeks, usually keeps room very warm, however is now keeping it very cold.  Has no complaints other than dizziness and dry mouth.  Is answering questions appropriately however is somewhat repititive at times.  EMS reports BP to 250/110

## 2015-07-24 NOTE — ED Notes (Signed)
MRI called, they will be coming to pick up pt shortly

## 2015-07-24 NOTE — ED Provider Notes (Signed)
CSN: [id number]     Arrival date & time 07/24/15  1015 History   First MD Initiated Contact with Patient 07/24/15 1017     Chief Complaint  Patient presents with  . Hypertension  . Altered Mental Status     (Consider location/radiation/quality/duration/timing/severity/associated sxs/prior Treatment) HPI Comments: Patient is a 81 year old female past medical history significant for hyperlipidemia, hypertension, glaucoma presenting to the emergency department via EMS for evaluation of hypertension as well as confusion. The patient states that she has been feeling dizzy since yesterday without associated headache, chest pain, shortness of breath, visual disturbance, nausea, vomiting, abdominal pain or syncopal episode. She states she believes this may be due to starting a new medication prescribed to her for her arthritis last week. She states she's feeling better since stopping the medication. She does endorse that she did not take any of her home medications this morning. No modifying factors identified. No aggravating factors identified. No other complaints.   Past Medical History  Diagnosis Date  . Hyperlipidemia   . Hypertension   . Prediabetes   . Vitamin D deficiency   . Glaucoma   . Crohn's disease    Past Surgical History  Procedure Laterality Date  . Tonsillectomy and adenoidectomy     History reviewed. No pertinent family history. Social History  Substance Use Topics  . Smoking status: Never Smoker   . Smokeless tobacco: None  . Alcohol Use: None   OB History    No data available     Review of Systems  Neurological: Positive for dizziness. Negative for syncope.  All other systems reviewed and are negative.     Allergies  Ace inhibitors  Home Medications   Prior to Admission medications   Medication Sig Start Date End Date Taking? Authorizing Provider  aspirin 81 MG chewable tablet Chew by mouth daily.   Yes Historical Provider, MD  atenolol (TENORMIN) 50  MG tablet TAKE 1 TABLET 3 TIMES A DAY. 06/24/15  Yes Unk Pinto, MD  balsalazide (COLAZAL) 750 MG capsule Take one tablet three times daily 01/29/15  Yes Vicie Mutters, PA-C  Cholecalciferol 4000 UNITS CAPS Take 4,000 Int'l Units by mouth daily.   Yes Historical Provider, MD  latanoprost (XALATAN) 0.005 % ophthalmic solution Place 1 drop into both eyes at bedtime.  12/06/14  Yes Historical Provider, MD  LUTEIN PO Take by mouth daily.   Yes Historical Provider, MD  meloxicam (MOBIC) 7.5 MG tablet Take one twice daily with food for 2 weeks, can take with tylenol, can not take with aleve, iburpofen, then as needed daily for pain Patient taking differently: Take 7.5 mg by mouth daily as needed for pain.  07/03/15  Yes Vicie Mutters, PA-C  Mesalamine (ASACOL HD) 800 MG TBEC Take 1 tablet (800 mg total) by mouth 2 (two) times daily. 06/10/15  Yes Vicie Mutters, PA-C  Red Yeast Rice 600 MG CAPS Take by mouth daily.   Yes Historical Provider, MD  baclofen (LIORESAL) 10 MG tablet Take 10 mg by mouth 3 (three) times daily. 07/15/15   Historical Provider, MD  traMADol (ULTRAM) 50 MG tablet Take 50 mg by mouth 3 (three) times daily as needed. pain 07/08/15   Historical Provider, MD   BP 160/57 mmHg  Pulse 64  Temp(Src) 97.9 F (36.6 C) (Oral)  Resp 19  SpO2 100% Physical Exam  Constitutional: She is oriented to person, place, and time. She appears well-developed and well-nourished. No distress.  HENT:  Head: Normocephalic and atraumatic.  Right Ear: External ear normal.  Left Ear: External ear normal.  Nose: Nose normal.  Mouth/Throat: Oropharynx is clear and moist. No oropharyngeal exudate.  Eyes: Conjunctivae and EOM are normal. Pupils are equal, round, and reactive to light.  Neck: Normal range of motion. Neck supple.  Cardiovascular: Normal rate, regular rhythm, normal heart sounds and intact distal pulses.   Pulmonary/Chest: Effort normal and breath sounds normal. No respiratory distress.   Abdominal: Soft. There is no tenderness.  Neurological: She is alert and oriented to person, place, and time. She has normal strength. No cranial nerve deficit. Gait normal. GCS eye subscore is 4. GCS verbal subscore is 5. GCS motor subscore is 6.  Sensation grossly intact.  No pronator drift.  Bilateral heel-knee-shin intact.  Skin: Skin is warm and dry. She is not diaphoretic.  Nursing note and vitals reviewed.   ED Course  Procedures (including critical care time) Medications  atenolol (TENORMIN) tablet 50 mg (50 mg Oral Given 07/24/15 1041)  hydrALAZINE (APRESOLINE) injection 10 mg (10 mg Intravenous Given 07/24/15 1157)  sodium chloride 0.9 % bolus 1,000 mL (0 mLs Intravenous Stopped 07/24/15 1415)    Labs Review Labs Reviewed  CBC - Abnormal; Notable for the following:    RBC 5.60 (*)    Hemoglobin 15.4 (*)    All other components within normal limits  BASIC METABOLIC PANEL - Abnormal; Notable for the following:    Sodium 133 (*)    Chloride 94 (*)    Glucose, Bld 129 (*)    GFR calc non Af Amer 46 (*)    GFR calc Af Amer 54 (*)    All other components within normal limits  URINALYSIS, ROUTINE W REFLEX MICROSCOPIC (NOT AT [facility]) - Abnormal; Notable for the following:    APPearance CLOUDY (*)    All other components within normal limits    Imaging Review Dg Chest 2 View  07/24/2015   CLINICAL DATA:  Patient with hypertension and dizziness.  EXAM: CHEST  2 VIEW  COMPARISON:  12/04/2008 chest radiograph.  FINDINGS: Monitoring leads overlie the patient. Stable enlarged cardiac and mediastinal contours. Elevation of the right hemidiaphragm. Stable coarse interstitial opacities bilaterally. No large areas of pulmonary consolidation. No pleural effusion or pneumothorax. Old healing right lateral rib fractures. Bilateral glenohumeral joint degenerative changes, right-greater-than-left.  IMPRESSION: No acute cardiopulmonary process.  Healing right lateral rib fractures.    Electronically Signed   By: Lovey Newcomer M.D.   On: 07/24/2015 11:44   Ct Head Wo Contrast  07/24/2015   CLINICAL DATA:  Dizziness today.  EXAM: CT HEAD WITHOUT CONTRAST  TECHNIQUE: Contiguous axial images were obtained from the base of the skull through the vertex without intravenous contrast.  COMPARISON:  None.  FINDINGS: There is some cortical atrophy and chronic microvascular ischemic change. No evidence of acute intracranial abnormality including hemorrhage, infarct, mass lesion, mass effect, midline shift or abnormal extra-axial fluid collection is identified. Small mucous retention cyst or polyp right maxillary sinus is noted. No pneumocephalus or hydrocephalus. The calvarium is intact.  IMPRESSION: No acute abnormality.  Mild atrophy and chronic microvascular ischemic change.   Electronically Signed   By: Inge Rise M.D.   On: 07/24/2015 11:36   Mr Brain Wo Contrast  07/24/2015   CLINICAL DATA:  Altered mental status.  Dizziness.  Hypertension.  EXAM: MRI HEAD WITHOUT CONTRAST  TECHNIQUE: Multiplanar, multiecho pulse sequences of the brain and surrounding structures were obtained without intravenous contrast.  COMPARISON:  Head  CT 07/24/2015  FINDINGS: Images are mildly motion degraded.  There is no evidence of acute infarct, intracranial hemorrhage, mass, midline shift, or extra-axial fluid collection. There is mild generalized cerebral atrophy. Patchy T2 hyperintensity in the subcortical and deep cerebral white matter is nonspecific but compatible with mild chronic small vessel ischemic disease.  Prior bilateral cataract extraction is noted. A small mucous retention cyst is noted in the right maxillary sinus. Mastoid air cells are clear. Major intracranial vascular flow voids are preserved.  IMPRESSION: 1. No acute intracranial abnormality. 2. Mild chronic small vessel ischemic disease.   Electronically Signed   By: Logan Bores   On: 07/24/2015 15:14   I have personally reviewed and  evaluated these images and lab results as part of my medical decision-making.   EKG Interpretation   Date/Time:  Thursday July 24 2015 10:20:03 EDT Ventricular Rate:  67 PR Interval:  119 QRS Duration: 95 QT Interval:  497 QTC Calculation: 525 R Axis:   18 Text Interpretation:  Sinus rhythm Borderline short PR interval Consider  right atrial enlargement LVH with secondary repolarization abnormality  Prolonged QT interval No old tracing to compare Confirmed by GOLDSTON  MD,  SCOTT ([id number]) on 07/24/2015 3:00:58 PM      Per family patient had some confusion while starting new muscle relaxant and anti-inflammatory prescribed by orthopedist, she has been back to baseline since stopping those medications. At baseline now per family.   MDM   Final diagnoses:  Hypertension, essential  Dizziness    Filed Vitals:   07/24/15 1400  BP: 160/57  Pulse:   Temp:   Resp: 19   Afebrile, NAD, non-toxic appearing, AAOx4.   Patient presents to the emergency department via EMS for evaluation of dizziness as well as high blood pressure. No neurofocal deficits on examination. No complaints of headache. Patient did not take home medications PTA, given home dose of Atenolol and dose of Hydralazine with improvement of BP. Atrophy without acute abnormality. CT scan unremarkable, no evidence of neurologic cause of dizziness. MRI obtained without acute neurologic finding. Likely adverse reaction from medication as symptoms have improved since stopping muscle relaxant. Advised patient to discontinue medication. Advised PCP follow-up. Return precautions discussed. Patient is agreeable to plan. Patient is stable at time of discharge. Patient d/w with Dr. Regenia Skeeter, agrees with plan.    Baron Sane, PA-C 07/26/15 2229  Sherwood Gambler, MD 07/27/15 [phone]

## 2015-07-24 NOTE — ED Notes (Signed)
Patient returned from MRI.

## 2015-07-24 NOTE — ED Notes (Addendum)
Pt transported to CT and x-ray  

## 2015-07-24 NOTE — ED Notes (Signed)
Anderson Malta PA at bedside

## 2015-07-24 NOTE — ED Notes (Addendum)
  Elmyra Ricks, Student PA at bedside.

## 2015-07-24 NOTE — ED Notes (Signed)
Report given to Rolm Gala, RN

## 2015-07-29 ENCOUNTER — Ambulatory Visit (INDEPENDENT_AMBULATORY_CARE_PROVIDER_SITE_OTHER): Payer: Commercial Managed Care - HMO | Admitting: Internal Medicine

## 2015-07-29 ENCOUNTER — Encounter: Payer: Self-pay | Admitting: Internal Medicine

## 2015-07-29 VITALS — BP 204/102 | HR 68 | Temp 98.0°F | Resp 16 | Ht 63.0 in | Wt 144.0 lb

## 2015-07-29 DIAGNOSIS — I10 Essential (primary) hypertension: Secondary | ICD-10-CM

## 2015-07-29 DIAGNOSIS — E039 Hypothyroidism, unspecified: Secondary | ICD-10-CM | POA: Diagnosis not present

## 2015-07-29 LAB — TSH: TSH: 3.441 u[IU]/mL (ref 0.350–4.500)

## 2015-07-29 MED ORDER — HYDRALAZINE HCL 10 MG PO TABS: ORAL_TABLET | ORAL | Status: DC

## 2015-07-29 NOTE — Progress Notes (Signed)
   Subjective:    Patient ID: Courtney Fuller, female    DOB: [birth year], 81 y.o.   MRN: [id number]  HPI  Patient presents to the office for evaluation of hospital follow-up.  She reports that she was at home and she knew what she wanted to say but she could not say it.  She was also having some issues with using her right hand.  Her speech was incomprehensible prior to going to the ER.  She was taken to the ER and had a full workup and other than being very hypertensive she was found to have normal examination and normal MRI.  She did return to baseline but it took approximately 2 days to return to baseline.  She had had some memory issues prior to this event, but it did get worse.    Blood pressure at home has been running 167/80  Patient has been very constipated lately.  She reports that she has tried prune juice, dulcolax, and miralax at home which didn't help.  She reports that consitpation is a chronic issue  Review of Systems  Constitutional: Negative for fever, chills and fatigue.  Eyes: Negative.   Respiratory: Negative for chest tightness and shortness of breath.   Cardiovascular: Negative for chest pain and palpitations.  Gastrointestinal: Negative for nausea and vomiting.  Neurological: Negative for dizziness, speech difficulty, weakness and headaches.  Psychiatric/Behavioral: Negative for confusion and agitation.       Objective:   Physical Exam  Constitutional: She is oriented to person, place, and time. She appears well-developed and well-nourished. No distress.  HENT:  Head: Normocephalic.  Mouth/Throat: Oropharynx is clear and moist. No oropharyngeal exudate.  Eyes: Conjunctivae are normal. No scleral icterus.  Neck: Normal range of motion. Neck supple. No JVD present. No thyromegaly present.  Cardiovascular: Normal rate, regular rhythm, normal heart sounds and intact distal pulses.  Exam reveals no gallop and no friction rub.   No murmur heard. Pulmonary/Chest:  Effort normal and breath sounds normal. No respiratory distress. She has no wheezes. She has no rales. She exhibits no tenderness.  Abdominal: Soft. Bowel sounds are normal. She exhibits no distension and no mass. There is no tenderness. There is no rebound and no guarding.  Musculoskeletal: Normal range of motion.  Lymphadenopathy:    She has no cervical adenopathy.  Neurological: She is alert and oriented to person, place, and time. She has normal strength. No cranial nerve deficit or sensory deficit. Coordination normal.  Normal heel to shin  Skin: Skin is warm and dry. She is not diaphoretic.  Psychiatric: She has a normal mood and affect. Her behavior is normal. Judgment and thought content normal.  Nursing note and vitals reviewed.         Assessment & Plan:    1. Hypothyroidism, unspecified hypothyroidism type - TSH  2. HTN -asymptomatic -is closely monitored at home -hydralazine BID at home and monitor closely -if weakness, CP, SOB go to ER -cont atenolol TID  3.  Possible TIA? -offered bilateral carotid duplex but given age and possible intervention patient and I both feel that it is reasonable to try increasing to full dose ASA and to get HTN under better control.  RECHECK IN 1 WEEK

## 2015-07-29 NOTE — Patient Instructions (Addendum)
Please stop taking cinnamon, CoQ10, flax seed, and red yeast rice as they are not necessary to your medical treatment.  You can continue to take them if you would like but lets simplify the routine.  Please start taking hydralazine two times a day once with breakfast and once with dinner.  Please have [facility] check your blood pressure 1-2 times daily and keep a log for me.  I will see you back in 1 week to see how the new medications are doing.  If blood pressures are consistently 150/90 or greater call the office.  Please keep drinking plenty of water.  You can take amitiza once with breakfast and once with lunch or dinner.  Stop all other stool softeners.   Please start taking a full dose aspirin daily.

## 2015-08-06 ENCOUNTER — Encounter: Payer: Self-pay | Admitting: Internal Medicine

## 2015-08-06 ENCOUNTER — Ambulatory Visit (INDEPENDENT_AMBULATORY_CARE_PROVIDER_SITE_OTHER): Payer: Commercial Managed Care - HMO | Admitting: Internal Medicine

## 2015-08-06 VITALS — BP 218/98 | HR 66 | Temp 98.0°F | Resp 16 | Ht 63.0 in | Wt 145.0 lb

## 2015-08-06 DIAGNOSIS — I10 Essential (primary) hypertension: Secondary | ICD-10-CM

## 2015-08-06 MED ORDER — AMLODIPINE BESYLATE 5 MG PO TABS: 5.0000 mg | ORAL_TABLET | Freq: Every day | ORAL | Status: DC

## 2015-08-06 NOTE — Progress Notes (Signed)
   Subjective:    Patient ID: Courtney Fuller, female    DOB: June 16, 1915, 81 y.o.   MRN: [id number]  HPI  Patient returns to the office for evaluation of hypertension.  She has been taking hydralazine at home in addition to her atenolol and she has continued to have very high blood pressure readings at home.  She has been more active and has been walking in the afternoons.  She reports blood pressure has not dropped below 180/88.  She has no CP, SOB, headaches, dizziness or nausea and vomiting.  She does have blurry vision but does have macular degeneration and hasn't been able to have her shot in 2 weeks.     Review of Systems  Constitutional: Negative for fever, chills and fatigue.  Eyes: Positive for visual disturbance.  Respiratory: Negative for chest tightness and shortness of breath.   Cardiovascular: Negative for chest pain and palpitations.  Genitourinary: Negative for urgency, frequency, difficulty urinating and vaginal pain.  Neurological: Negative for weakness and numbness.       Objective:   Physical Exam  Constitutional: She is oriented to person, place, and time. She appears well-developed and well-nourished. No distress.  HENT:  Head: Normocephalic and atraumatic.  Mouth/Throat: Oropharynx is clear and moist. No oropharyngeal exudate.  Eyes: Conjunctivae are normal. No scleral icterus.  Neck: Normal range of motion. Neck supple. No JVD present. No thyromegaly present.  Cardiovascular: Normal rate, regular rhythm, normal heart sounds and intact distal pulses.  Exam reveals no gallop and no friction rub.   No murmur heard. Pulmonary/Chest: Effort normal and breath sounds normal. No respiratory distress. She has no wheezes. She has no rales. She exhibits no tenderness.  Musculoskeletal: Normal range of motion.  Lymphadenopathy:    She has no cervical adenopathy.  Neurological: She is alert and oriented to person, place, and time. No cranial nerve deficit. Coordination  normal.  Skin: Skin is warm and dry. She is not diaphoretic.  Psychiatric: She has a normal mood and affect. Her behavior is normal. Judgment and thought content normal.  Nursing note and vitals reviewed.         Assessment & Plan:    1. Essential hypertension -cont atenolol -amlodipine -hydralazine -currently asymptomatic with normal neuro exam -patient to return in 5 days for repeat OV -patient and family reminded to go to ER if chest pain, weakness, shortness of breath or facial droop.

## 2015-08-06 NOTE — Patient Instructions (Signed)
Amlodipine tablets What is this medicine? AMLODIPINE (am LOE di peen) is a calcium-channel blocker. It affects the amount of calcium found in your heart and muscle cells. This relaxes your blood vessels, which can reduce the amount of work the heart has to do. This medicine is used to lower high blood pressure. It is also used to prevent chest pain. This medicine may be used for other purposes; ask your health care provider or pharmacist if you have questions. COMMON BRAND NAME(S): Norvasc What should I tell my health care provider before I take this medicine? They need to know if you have any of these conditions: -heart problems like heart failure or aortic stenosis -liver disease -an unusual or allergic reaction to amlodipine, other medicines, foods, dyes, or preservatives -pregnant or trying to get pregnant -breast-feeding How should I use this medicine? Take this medicine by mouth with a glass of water. Follow the directions on the prescription label. Take your medicine at regular intervals. Do not take more medicine than directed. Talk to your pediatrician regarding the use of this medicine in children. Special care may be needed. This medicine has been used in children as young as 6. Persons over 26 years old may have a stronger reaction to this medicine and need smaller doses. Overdosage: If you think you have taken too much of this medicine contact a poison control center or emergency room at once. NOTE: This medicine is only for you. Do not share this medicine with others. What if I miss a dose? If you miss a dose, take it as soon as you can. If it is almost time for your next dose, take only that dose. Do not take double or extra doses. What may interact with this medicine? -herbal or dietary supplements -local or general anesthetics -medicines for high blood pressure -medicines for prostate problems -rifampin This list may not describe all possible interactions. Give your health  care provider a list of all the medicines, herbs, non-prescription drugs, or dietary supplements you use. Also tell them if you smoke, drink alcohol, or use illegal drugs. Some items may interact with your medicine. What should I watch for while using this medicine? Visit your doctor or health care professional for regular check ups. Check your blood pressure and pulse rate regularly. Ask your health care professional what your blood pressure and pulse rate should be, and when you should contact him or her. This medicine may make you feel confused, dizzy or lightheaded. Do not drive, use machinery, or do anything that needs mental alertness until you know how this medicine affects you. To reduce the risk of dizzy or fainting spells, do not sit or stand up quickly, especially if you are an older patient. Avoid alcoholic drinks; they can make you more dizzy. Do not suddenly stop taking amlodipine. Ask your doctor or health care professional how you can gradually reduce the dose. What side effects may I notice from receiving this medicine? Side effects that you should report to your doctor or health care professional as soon as possible: -allergic reactions like skin rash, itching or hives, swelling of the face, lips, or tongue -breathing problems -changes in vision or hearing -chest pain -fast, irregular heartbeat -swelling of legs or ankles Side effects that usually do not require medical attention (report to your doctor or health care professional if they continue or are bothersome): -dry mouth -facial flushing -nausea, vomiting -stomach gas, pain -tired, weak -trouble sleeping This list may not describe all possible side  effects. Call your doctor for medical advice about side effects. You may report side effects to FDA at 1-800-FDA-1088. °Where should I keep my medicine? °Keep out of the reach of children. °Store at room temperature between 59 and 86 degrees F (15 and 30 degrees C). Protect from  light. Keep container tightly closed. Throw away any unused medicine after the expiration date. °NOTE: This sheet is a summary. It may not cover all possible information. If you have questions about this medicine, talk to your doctor, pharmacist, or health care provider. °© 2015, [employer]vier/[employer] Standard. (2012-10-20 11:40:58) ° °

## 2015-08-12 ENCOUNTER — Telehealth: Payer: Self-pay | Admitting: Internal Medicine

## 2015-08-12 ENCOUNTER — Ambulatory Visit: Payer: Self-pay | Admitting: Internal Medicine

## 2015-08-12 MED ORDER — ISOSORBIDE MONONITRATE ER 60 MG PO TB24: ORAL_TABLET | ORAL | Status: DC

## 2015-08-12 NOTE — Telephone Encounter (Signed)
Nash Mantis is calling on behalf of patient and reports that blood pressure has come down but she only have a single reading.  She does report that it is very difficult to bring her to the office.  They would prefer to not come in today.  Blood pressure was 190/78. I discussed with Dr. Melford Aase and spoke with Barbera Setters and we will stop hydralazine and will start on imdur 30 mg once daily and will see back Friday morning.

## 2015-08-14 DIAGNOSIS — H3532 Exudative age-related macular degeneration: Secondary | ICD-10-CM | POA: Diagnosis not present

## 2015-08-15 ENCOUNTER — Ambulatory Visit: Payer: Self-pay | Admitting: Internal Medicine

## 2015-09-04 ENCOUNTER — Encounter: Payer: Self-pay | Admitting: Physician Assistant

## 2015-09-04 ENCOUNTER — Ambulatory Visit (INDEPENDENT_AMBULATORY_CARE_PROVIDER_SITE_OTHER): Payer: Commercial Managed Care - HMO | Admitting: Physician Assistant

## 2015-09-04 VITALS — BP 170/80 | HR 66 | Temp 97.5°F | Resp 14 | Ht 63.0 in | Wt 148.0 lb

## 2015-09-04 DIAGNOSIS — K5090 Crohn's disease, unspecified, without complications: Secondary | ICD-10-CM | POA: Diagnosis not present

## 2015-09-04 DIAGNOSIS — I10 Essential (primary) hypertension: Secondary | ICD-10-CM | POA: Diagnosis not present

## 2015-09-04 DIAGNOSIS — Z23 Encounter for immunization: Secondary | ICD-10-CM

## 2015-09-04 MED ORDER — MESALAMINE 800 MG PO TBEC: 800.0000 mg | DELAYED_RELEASE_TABLET | Freq: Two times a day (BID) | ORAL | Status: DC

## 2015-09-04 NOTE — Patient Instructions (Signed)
Continue medications the same.   Please monitor your blood pressure, as we get older our body can not respond to a low blood pressure as well as it did when we were younger, for this reason we want a bit higher of a blood pressure as you get older to avoid dizziness and fatigue which can lead to falls. Pease call if your blood pressure is consistently above 170/90.    Go to the ER if any CP, SOB, nausea, dizziness, severe HA, changes vision/speech  DASH Eating Plan DASH stands for "Dietary Approaches to Stop Hypertension." The DASH eating plan is a healthy eating plan that has been shown to reduce high blood pressure (hypertension). Additional health benefits may include reducing the risk of type 2 diabetes mellitus, heart disease, and stroke. The DASH eating plan may also help with weight loss. WHAT DO I NEED TO KNOW ABOUT THE DASH EATING PLAN? For the DASH eating plan, you will follow these general guidelines:  Choose foods with a percent daily value for sodium of less than 5% (as listed on the food label).  Use salt-free seasonings or herbs instead of table salt or sea salt.  Check with your health care provider or pharmacist before using salt substitutes.  Eat lower-sodium products, often labeled as "lower sodium" or "no salt added."  Eat fresh foods.  Eat more vegetables, fruits, and low-fat dairy products.  Choose whole grains. Look for the word "whole" as the first word in the ingredient list.  Choose fish and skinless chicken or [place] more often than red meat. Limit fish, poultry, and meat to 6 oz (170 g) each day.  Limit sweets, desserts, sugars, and sugary drinks.  Choose heart-healthy fats.  Limit cheese to 1 oz (28 g) per day.  Eat more home-cooked food and less restaurant, buffet, and fast food.  Limit fried foods.  Cook foods using methods other than frying.  Limit canned vegetables. If you do use them, rinse them well to decrease the sodium.  When eating at a  restaurant, ask that your food be prepared with less salt, or no salt if possible. WHAT FOODS CAN I EAT? Seek help from a dietitian for individual calorie needs. Grains Whole grain or whole wheat bread. Brown rice. Whole grain or whole wheat pasta. Quinoa, bulgur, and whole grain cereals. Low-sodium cereals. Corn or whole wheat flour tortillas. Whole grain cornbread. Whole grain crackers. Low-sodium crackers. Vegetables Fresh or frozen vegetables (raw, steamed, roasted, or grilled). Low-sodium or reduced-sodium tomato and vegetable juices. Low-sodium or reduced-sodium tomato sauce and paste. Low-sodium or reduced-sodium canned vegetables.  Fruits All fresh, canned (in natural juice), or frozen fruits. Meat and Other Protein Products Ground beef (85% or leaner), grass-fed beef, or beef trimmed of fat. Skinless chicken or [place]. Ground chicken or [place]. Pork trimmed of fat. All fish and seafood. Eggs. Dried beans, peas, or lentils. Unsalted nuts and seeds. Unsalted canned beans. Dairy Low-fat dairy products, such as skim or 1% milk, 2% or reduced-fat cheeses, low-fat ricotta or cottage cheese, or plain low-fat yogurt. Low-sodium or reduced-sodium cheeses. Fats and Oils Tub margarines without trans fats. Light or reduced-fat mayonnaise and salad dressings (reduced sodium). Avocado. Safflower, olive, or canola oils. Natural peanut or almond butter. Other Unsalted popcorn and pretzels. The items listed above may not be a complete list of recommended foods or beverages. Contact your dietitian for more options. WHAT FOODS ARE NOT RECOMMENDED? Grains White bread. White pasta. White rice. Refined cornbread. Bagels and croissants. Crackers  that contain trans fat. Vegetables Creamed or fried vegetables. Vegetables in a cheese sauce. Regular canned vegetables. Regular canned tomato sauce and paste. Regular tomato and vegetable juices. Fruits Dried fruits. Canned fruit in light or heavy syrup. Fruit  juice. Meat and Other Protein Products Fatty cuts of meat. Ribs, chicken wings, bacon, sausage, bologna, salami, chitterlings, fatback, hot dogs, bratwurst, and packaged luncheon meats. Salted nuts and seeds. Canned beans with salt. Dairy Whole or 2% milk, cream, half-and-half, and cream cheese. Whole-fat or sweetened yogurt. Full-fat cheeses or blue cheese. Nondairy creamers and whipped toppings. Processed cheese, cheese spreads, or cheese curds. Condiments Onion and garlic salt, seasoned salt, table salt, and sea salt. Canned and packaged gravies. Worcestershire sauce. Tartar sauce. Barbecue sauce. Teriyaki sauce. Soy sauce, including reduced sodium. Steak sauce. Fish sauce. Oyster sauce. Cocktail sauce. Horseradish. Ketchup and mustard. Meat flavorings and tenderizers. Bouillon cubes. Hot sauce. Tabasco sauce. Marinades. Taco seasonings. Relishes. Fats and Oils Butter, stick margarine, lard, shortening, ghee, and bacon fat. Coconut, palm kernel, or palm oils. Regular salad dressings. Other Pickles and olives. Salted popcorn and pretzels. The items listed above may not be a complete list of foods and beverages to avoid. Contact your dietitian for more information. WHERE CAN I FIND MORE INFORMATION? National Heart, Lung, and Blood Institute: travelstabloid.com Document Released: 11/11/2011 Document Revised: 04/08/2014 Document Reviewed: 09/26/2013 [facility] Patient Information 2015 [place], [place]. This information is not intended to replace advice given to you by your health care provider. Make sure you discuss any questions you have with your health care provider.

## 2015-09-04 NOTE — Progress Notes (Signed)
Subjective:    Patient ID: Courtney Fuller, female    DOB: May 09, 1915, 81 y.o.   MRN: [id number]  HPI 81 y.o. WF with history of HTN, chol, preDM, vit D, crohn's disease presents for HTN. She was in the hospital due to dizziness and hypertension, had negative CT scan, she has had elevated BP since that time. She is on atenolol 50mg  TID, added on norvasc 5mg , and imdur 1/2 daily. Bp has been coming down. Patient denies changes in vision, HA, trouble with speech, focal weakness.   Blood pressure 170/80, pulse 66, temperature 97.5 F (36.4 C), temperature source Temporal, resp. rate 14, height 5\' 3"  (1.6 m), weight 148 lb (67.132 kg), SpO2 95 %.   BP Readings from Last 20 Encounters:  09/04/15 170/80  08/06/15 218/98  07/29/15 204/102  07/24/15 187/53  07/03/15 142/96  01/07/15 134/96  12/25/14 142/90  12/19/13 132/82     Current Outpatient Prescriptions on File Prior to Visit  Medication Sig Dispense Refill  . amLODipine (NORVASC) 5 MG tablet Take 1 tablet (5 mg total) by mouth daily. 30 tablet 11  . aspirin 81 MG chewable tablet Chew by mouth daily.    Marland Kitchen atenolol (TENORMIN) 50 MG tablet TAKE 1 TABLET 3 TIMES A DAY. 270 tablet 0  . Cholecalciferol 4000 UNITS CAPS Take 4,000 Int'l Units by mouth daily.    Marland Kitchen latanoprost (XALATAN) 0.005 % ophthalmic solution Place 1 drop into both eyes at bedtime.     . isosorbide mononitrate (IMDUR) 60 MG 24 hr tablet Take 1/2 tablet to 1 tablet daily as needed for blood pressure.  Hold medication if blood pressure is less than 140/90 30 tablet 0   No current facility-administered medications on file prior to visit.   Past Medical History  Diagnosis Date  . Hyperlipidemia   . Hypertension   . Prediabetes   . Vitamin D deficiency   . Glaucoma   . Crohn's disease     Review of Systems  Constitutional: Negative for fever, chills and fatigue.  Eyes: Negative.   Respiratory: Negative for chest tightness and shortness of breath.    Cardiovascular: Negative for chest pain and palpitations.  Gastrointestinal: Negative for nausea and vomiting.  Neurological: Negative for dizziness, speech difficulty, weakness and headaches.  Psychiatric/Behavioral: Negative for confusion and agitation.       Objective:   Physical Exam  Constitutional: She is oriented to person, place, and time. She appears well-developed and well-nourished. No distress.  HENT:  Head: Normocephalic.  Mouth/Throat: Oropharynx is clear and moist. No oropharyngeal exudate.  Eyes: Conjunctivae are normal. No scleral icterus.  Neck: Normal range of motion. Neck supple. No JVD present. No thyromegaly present.  Cardiovascular: Normal rate, regular rhythm, normal heart sounds and intact distal pulses.  Exam reveals no gallop and no friction rub.   No murmur heard. Pulmonary/Chest: Effort normal and breath sounds normal. No respiratory distress. She has no wheezes. She has no rales. She exhibits no tenderness.  Abdominal: Soft. Bowel sounds are normal. She exhibits no distension and no mass. There is no tenderness. There is no rebound and no guarding.  Musculoskeletal: Normal range of motion.  Lymphadenopathy:    She has no cervical adenopathy.  Neurological: She is alert and oriented to person, place, and time. She has normal strength. No cranial nerve deficit or sensory deficit. Coordination normal.  Normal heel to shin  Skin: Skin is warm and dry. She is not diaphoretic.  Left cheek with scaly erythematous  nodule, new, will monitor  Psychiatric: She has a normal mood and affect. Her behavior is normal. Judgment and thought content normal.  Nursing note and vitals reviewed.     Assessment & Plan:  Hypertension- continue medications the same for now-  Will keep a bit higher of a BP, normal neuro.  Need influenza and prevnar 13.   Future Appointments Date Time Provider [place]  01/01/2016 2:00 PM Vicie Mutters, PA-C [id number] None

## 2015-09-22 ENCOUNTER — Other Ambulatory Visit: Payer: Self-pay | Admitting: Internal Medicine

## 2015-11-05 DIAGNOSIS — Z961 Presence of intraocular lens: Secondary | ICD-10-CM | POA: Diagnosis not present

## 2015-11-05 DIAGNOSIS — H40053 Ocular hypertension, bilateral: Secondary | ICD-10-CM | POA: Diagnosis not present

## 2015-11-05 DIAGNOSIS — H353112 Nonexudative age-related macular degeneration, right eye, intermediate dry stage: Secondary | ICD-10-CM | POA: Diagnosis not present

## 2015-11-05 DIAGNOSIS — H353223 Exudative age-related macular degeneration, left eye, with inactive scar: Secondary | ICD-10-CM | POA: Diagnosis not present

## 2015-11-13 DIAGNOSIS — H353222 Exudative age-related macular degeneration, left eye, with inactive choroidal neovascularization: Secondary | ICD-10-CM | POA: Diagnosis not present

## 2015-11-13 DIAGNOSIS — H35363 Drusen (degenerative) of macula, bilateral: Secondary | ICD-10-CM | POA: Diagnosis not present

## 2015-11-13 DIAGNOSIS — H353134 Nonexudative age-related macular degeneration, bilateral, advanced atrophic with subfoveal involvement: Secondary | ICD-10-CM | POA: Diagnosis not present

## 2015-12-22 ENCOUNTER — Other Ambulatory Visit: Payer: Self-pay | Admitting: Internal Medicine

## 2016-01-01 ENCOUNTER — Ambulatory Visit (INDEPENDENT_AMBULATORY_CARE_PROVIDER_SITE_OTHER): Payer: Commercial Managed Care - HMO | Admitting: Physician Assistant

## 2016-01-01 ENCOUNTER — Encounter: Payer: Self-pay | Admitting: Physician Assistant

## 2016-01-01 VITALS — BP 142/90 | HR 90 | Temp 97.7°F | Resp 14 | Ht 63.0 in | Wt 149.4 lb

## 2016-01-01 DIAGNOSIS — H3530 Unspecified macular degeneration: Secondary | ICD-10-CM

## 2016-01-01 DIAGNOSIS — E559 Vitamin D deficiency, unspecified: Secondary | ICD-10-CM | POA: Diagnosis not present

## 2016-01-01 DIAGNOSIS — Z0000 Encounter for general adult medical examination without abnormal findings: Secondary | ICD-10-CM

## 2016-01-01 DIAGNOSIS — Z23 Encounter for immunization: Secondary | ICD-10-CM | POA: Diagnosis not present

## 2016-01-01 DIAGNOSIS — Z0001 Encounter for general adult medical examination with abnormal findings: Secondary | ICD-10-CM | POA: Diagnosis not present

## 2016-01-01 DIAGNOSIS — E785 Hyperlipidemia, unspecified: Secondary | ICD-10-CM

## 2016-01-01 DIAGNOSIS — R7303 Prediabetes: Secondary | ICD-10-CM | POA: Diagnosis not present

## 2016-01-01 DIAGNOSIS — N183 Chronic kidney disease, stage 3 unspecified: Secondary | ICD-10-CM

## 2016-01-01 DIAGNOSIS — Z79899 Other long term (current) drug therapy: Secondary | ICD-10-CM

## 2016-01-01 DIAGNOSIS — K5010 Crohn's disease of large intestine without complications: Secondary | ICD-10-CM | POA: Diagnosis not present

## 2016-01-01 DIAGNOSIS — R6889 Other general symptoms and signs: Secondary | ICD-10-CM | POA: Diagnosis not present

## 2016-01-01 DIAGNOSIS — M1990 Unspecified osteoarthritis, unspecified site: Secondary | ICD-10-CM | POA: Insufficient documentation

## 2016-01-01 DIAGNOSIS — H409 Unspecified glaucoma: Secondary | ICD-10-CM | POA: Diagnosis not present

## 2016-01-01 DIAGNOSIS — I10 Essential (primary) hypertension: Secondary | ICD-10-CM | POA: Diagnosis not present

## 2016-01-01 LAB — CBC WITH DIFFERENTIAL/PLATELET
BASOS ABS: 0 10*3/uL (ref 0.0–0.1)
BASOS PCT: 0 % (ref 0–1)
EOS ABS: 0.2 10*3/uL (ref 0.0–0.7)
Eosinophils Relative: 2 % (ref 0–5)
HCT: 41.2 % (ref 36.0–46.0)
HEMOGLOBIN: 13.4 g/dL (ref 12.0–15.0)
Lymphocytes Relative: 16 % (ref 12–46)
Lymphs Abs: 1.4 10*3/uL (ref 0.7–4.0)
MCH: 27.2 pg (ref 26.0–34.0)
MCHC: 32.5 g/dL (ref 30.0–36.0)
MCV: 83.7 fL (ref 78.0–100.0)
MONOS PCT: 11 % (ref 3–12)
MPV: 10.3 fL (ref 8.6–12.4)
Monocytes Absolute: 1 10*3/uL (ref 0.1–1.0)
NEUTROS ABS: 6.2 10*3/uL (ref 1.7–7.7)
NEUTROS PCT: 71 % (ref 43–77)
PLATELETS: 324 10*3/uL (ref 150–400)
RBC: 4.92 MIL/uL (ref 3.87–5.11)
RDW: 14.8 % (ref 11.5–15.5)
WBC: 8.8 10*3/uL (ref 4.0–10.5)

## 2016-01-01 LAB — HEPATIC FUNCTION PANEL
ALT: 9 U/L (ref 6–29)
AST: 15 U/L (ref 10–35)
Albumin: 3.7 g/dL (ref 3.6–5.1)
Alkaline Phosphatase: 59 U/L (ref 33–130)
BILIRUBIN DIRECT: 0.1 mg/dL (ref <=0.2)
BILIRUBIN INDIRECT: 0.4 mg/dL (ref 0.2–1.2)
BILIRUBIN TOTAL: 0.5 mg/dL (ref 0.2–1.2)
Total Protein: 6.9 g/dL (ref 6.1–8.1)

## 2016-01-01 LAB — BASIC METABOLIC PANEL WITH GFR
BUN: 31 mg/dL — ABNORMAL HIGH (ref 7–25)
CHLORIDE: 101 mmol/L (ref 98–110)
CO2: 28 mmol/L (ref 20–31)
Calcium: 9 mg/dL (ref 8.6–10.4)
Creat: 1.36 mg/dL — ABNORMAL HIGH (ref 0.60–0.88)
GFR, EST NON AFRICAN AMERICAN: 32 mL/min — AB (ref >=60)
GFR, Est African American: 37 mL/min — ABNORMAL LOW (ref >=60)
Glucose, Bld: 93 mg/dL (ref 65–99)
POTASSIUM: 4.8 mmol/L (ref 3.5–5.3)
SODIUM: 139 mmol/L (ref 135–146)

## 2016-01-01 LAB — TSH: TSH: 4.932 u[IU]/mL — AB (ref 0.350–4.500)

## 2016-01-01 LAB — MAGNESIUM: Magnesium: 2.2 mg/dL (ref 1.5–2.5)

## 2016-01-01 MED ORDER — TRAMADOL HCL 50 MG PO TABS: 50.0000 mg | ORAL_TABLET | Freq: Three times a day (TID) | ORAL | Status: DC | PRN

## 2016-01-01 MED ORDER — MESALAMINE 800 MG PO TBEC: 800.0000 mg | DELAYED_RELEASE_TABLET | Freq: Two times a day (BID) | ORAL | Status: DC

## 2016-01-01 NOTE — Progress Notes (Signed)
MEDICARE ANNUAL WELLNESS VISIT AND FOLLOW UP  Assessment:   1. Essential hypertension - atenolol (TENORMIN) 50 MG tablet; TAKE 1 TABLET 3 (THREE) TIMES DAILY.  Dispense: 270 tablet; Refill: 2 - CBC with Differential - BASIC METABOLIC PANEL WITH GFR - Hepatic function panel - TSH  2. Crohn's disease, without complications -Asacol 800mg  BID  3. Prediabetes - HM DIABETES FOOT EXAM  4. Hyperlipidemia -decrease fatty foods, increase activity.   5. Vitamin D deficiency Continue supplement  6. Glaucoma Continue follow up  7. Macular degeneration Continue follow up  8. CKD (chronic kidney disease) stage 3, GFR 30-59 ml/min Increase fluids, avoid NSAIDS/take sparingly, monitor sugars, will monitor  9. Medication management - Magnesium- cut back to 400mg  daily   10. OA Tramadol PRN  11. Prevnar 13   Plan:   During the course of the visit the patient was educated and counseled about appropriate screening and preventive services including:    Pneumococcal vaccine   Influenza vaccine  Td vaccine  Screening electrocardiogram  Bone densitometry screening  Colorectal cancer screening  Diabetes screening  Glaucoma screening  Nutrition counseling   Advanced directives: requested  Screening recommendations, referrals: Vaccinations: Please see documentation below and orders this visit.  Nutrition assessed and recommended  Colonoscopy declined Recommended yearly ophthalmology/optometry visit for glaucoma screening and checkup Recommended yearly dental visit for hygiene and checkup Advanced directives - requested  Conditions/risks identified: BMI: Discussed weight loss, diet, and increase physical activity.  Increase physical activity: AHA recommends 150 minutes of physical activity a week.  Medications reviewed Diabetes is at goal, ACE/ARB therapy: No, Reason not on Ace Inhibitor/ARB therapy:  preDM Urinary Incontinence is not an issue: discussed non  pharmacology and pharmacology options.  Fall risk: moderate- discussed PT, home fall assessment, medications.    Subjective:  Courtney Fuller is a 82 y.o. female who presents for Medicare Annual Wellness Visit and follow up  Date of last medicare wellness visit was 12/2014   Her blood pressure has been controlled at home, she has been controlled, will start checking once a month, today their BP is BP: (!) 142/90 mmHg She does not workout. She denies chest pain, shortness of breath, dizziness.  She is not on cholesterol medication and denies myalgias. Her cholesterol is at goal. The cholesterol last visit was:   Lab Results  Component Value Date   CHOL 203* 12/25/2014   HDL 42 12/25/2014   LDLCALC 127* 12/25/2014   TRIG 170* 12/25/2014   CHOLHDL 4.8 12/25/2014   She has been working on diet and exercise for prediabetes, and denies paresthesia of the feet, polydipsia, polyuria and visual disturbances. A1C was 6.0 in the 2014. Last A1C in the office was:  Lab Results  Component Value Date   HGBA1C 5.5 12/19/2013  Patient is on Vitamin D supplement.   She has CKD due to age/HTN Lab Results  Component Value Date   GFRNONAA 46* 07/24/2015  She follows with Dr. Katy Fitch and Dr. Zadie Rhine for [name] and [name] but she is no longer going.  She has crohn's disease and is on lialda for this and has done well on this for many years and would like to stay on this.  No aches and pain except her feet, she will use aleve for this occ.  Currently lives at [facility] and she is DNR.    Names of Other Physician/Practitioners you currently use: 1. [facility] here for primary care 2. Dr. Katy Fitch, eye  doctor, q 6 months 3. Dr. Zadie Rhine q 6 months [name], MD as PCP - General (Internal Medicine)   Medication Review: Current Outpatient Prescriptions on File Prior to Visit  Medication Sig Dispense Refill  . amLODipine (NORVASC) 5 MG tablet  Take 1 tablet (5 mg total) by mouth daily. 30 tablet 11  . aspirin 81 MG chewable tablet Chew by mouth daily.    Marland Kitchen atenolol (TENORMIN) 50 MG tablet TAKE 1 TABLET 3 TIMES A DAY. 270 tablet 3  . Cholecalciferol 4000 UNITS CAPS Take 4,000 Int'l Units by mouth daily.    . isosorbide mononitrate (IMDUR) 60 MG 24 hr tablet TAKE 1/2 TO 1 TABLET DAILY AS NEEDED FOR BLOOD PRESSURE, HOLD IF BP LESS THAN 140/90 30 tablet 3  . latanoprost (XALATAN) 0.005 % ophthalmic solution Place 1 drop into both eyes at bedtime.     . Mesalamine (ASACOL HD) 800 MG TBEC Take 1 tablet (800 mg total) by mouth 2 (two) times daily. 180 tablet 3   No current facility-administered medications on file prior to visit.    Current Problems (verified) Patient Active Problem List   Diagnosis Date Noted  . Encounter for Medicare annual wellness exam 07/03/2015  . Macular degeneration 12/25/2014  . CKD (chronic kidney disease) stage 3, GFR 30-59 ml/min 12/25/2014  . Hyperlipidemia   . Hypertension   . Prediabetes   . Vitamin D deficiency   . Glaucoma   . Crohn's disease ([place])    Screening Tests Immunization History  Administered Date(s) Administered  . Influenza, High Dose Seasonal PF 09/04/2015  . Influenza-Unspecified 09/18/2013  . Pneumococcal Polysaccharide-23 09/04/2003  . Tdap 12/13/2012  . Zoster 09/28/2006   Preventative care: Tetanus: 2014 declines another Pneumovax: 2004 declines another Prevnar 13: Will get today Flu vaccine: 09/2015 Zostavax: 2007 Pap: declines MGM: declines DEXA: declines Colonoscopy: 2004- declines another due to age EGD: declines  CXR: 2009  Past Surgical History  Procedure Laterality Date  . Tonsillectomy and adenoidectomy     No family history on file.   Risk Factors: Osteoporosis/FallRisk: postmenopausal estrogen deficiency and dietary calcium and/or vitamin D deficiency In the past year have you fallen or had a near fall?:No History of fracture in the past year:  no  Tobacco Social History  Substance Use Topics  . Smoking status: Never Smoker   . Smokeless tobacco: None  . Alcohol Use: None   She does not smoke.  Patient is not a former smoker. Are there smokers in your home (other than you)?  No  Alcohol Current alcohol use: none  Caffeine Current caffeine use: denies use  Exercise Current exercise: none  Nutrition/Diet Current diet: in general, a "healthy" diet    Cardiac risk factors: advanced age (older than 31 for men, 70 for women), dyslipidemia and hypertension.  Depression Screen (Note: if answer to either of the following is "Yes", a more complete depression screening is indicated)   Q1: Over the past two weeks, have you felt down, depressed or hopeless? No  Q2: Over the past two weeks, have you felt little interest or pleasure in doing things? No  Have you lost interest or pleasure in daily life? No  Do you often feel hopeless? No  Do you cry easily over simple problems? No  Activities of Daily Living In your present state of health, do you have any difficulty performing the following activities?:  Driving? Yes Managing money?  No Feeding yourself? No Getting from bed to chair?  No Climbing a flight of stairs? No Preparing food and eating?: No Bathing or showering? No Getting dressed: No Getting to the toilet? No Using the toilet:No Moving around from place to place: No In the past year have you fallen or had a near fall?:No   Are you sexually active?  No  Do you have more than one partner?  No  Vision Difficulties: Yes  Hearing Difficulties: No Do you often ask people to speak up or repeat themselves? No Do you experience ringing or noises in your ears? No Do you have difficulty understanding soft or whispered voices? No  Cognition  Do you feel that you have a problem with memory?No  Do you often misplace items? No  Do you feel safe at home?  Yes  Advanced directives Does patient have a [place]? Yes Does patient have a Living Will? Yes Patient DNR   Objective:     Blood pressure 142/90, pulse 90, temperature 97.7 F (36.5 C), temperature source Temporal, resp. rate 14, height 5\' 3"  (1.6 m), weight 149 lb 6.4 oz (67.767 kg), SpO2 93 %. Body mass index is 26.47 kg/(m^2).  General appearance: alert, no distress, WD/WN, female Cognitive Testing  Alert? Yes  Normal Appearance?Yes  Oriented to person? Yes  Place? Yes   Time? Yes  Recall of three objects?  Yes  Can perform simple calculations? Yes  Displays appropriate judgment?Yes  Can read the correct time from a watch face?Yes  HEENT: normocephalic, sclerae anicteric, TMs pearly, nares patent, no discharge or erythema, pharynx normal Oral cavity: MMM, no lesions Neck: supple, no lymphadenopathy, no thyromegaly, no masses Heart: RRR, normal S1, S2, no murmurs Lungs: CTA bilaterally, no wheezes, rhonchi, or rales Abdomen: +bs, soft, non tender, non distended, no masses, no hepatomegaly, no splenomegaly Musculoskeletal: nontender, no swelling, no obvious deformity Extremities: no edema, no cyanosis, no clubbing Pulses: 2+ symmetric, upper and lower extremities, normal cap refill Neurological: alert, oriented x 3, CN2-12 intact, strength normal upper extremities and lower extremities, sensation normal throughout, DTRs 2+ throughout, no cerebellar signs, gait slow Skin: scaly erythematous bump on forehead, states from bumping it.  Psychiatric: normal affect, behavior normal, pleasant   Medicare Attestation I have personally reviewed: The patient's medical and social history Their use of alcohol, tobacco or illicit drugs Their current medications and supplements The patient's functional ability including ADLs,fall risks, home safety risks, cognitive, and hearing and visual impairment Diet and physical activities Evidence for depression or mood disorders  The patient's weight, height, BMI, and visual acuity  have been recorded in the chart.  I have made referrals, counseling, and provided education to the patient based on review of the above and I have provided the patient with a written personalized care plan for preventive services.     Vicie Mutters, PA-C   01/01/2016

## 2016-01-01 NOTE — Patient Instructions (Addendum)
Only take the magnesium if you have leg cramping or if you have constipation, can take 400mg  with food, do not take anymore than that because it can cause diarrhea.  The atenolol is a long acting so you can take 2 in the morning (100mg ) and 1 at night (50mg  at night)  Can check your blood pressure once a month or every 3 months if it has been below 170/80  For your forehead use neosporin for 3 days and then you can put vaseline on it once or twice a day for 7 days.  If it is not better we will freeze it next time you are in the office.

## 2016-01-20 ENCOUNTER — Other Ambulatory Visit: Payer: Self-pay | Admitting: Internal Medicine

## 2016-01-21 ENCOUNTER — Other Ambulatory Visit: Payer: Self-pay | Admitting: Physician Assistant

## 2016-01-22 ENCOUNTER — Encounter: Payer: Self-pay | Admitting: Physician Assistant

## 2016-01-22 ENCOUNTER — Ambulatory Visit (INDEPENDENT_AMBULATORY_CARE_PROVIDER_SITE_OTHER): Payer: Commercial Managed Care - HMO | Admitting: Physician Assistant

## 2016-01-22 VITALS — BP 140/80 | HR 55 | Temp 97.3°F | Resp 14 | Ht 63.0 in | Wt 144.0 lb

## 2016-01-22 DIAGNOSIS — I10 Essential (primary) hypertension: Secondary | ICD-10-CM | POA: Diagnosis not present

## 2016-01-22 DIAGNOSIS — N183 Chronic kidney disease, stage 3 unspecified: Secondary | ICD-10-CM

## 2016-01-22 MED ORDER — AMLODIPINE BESYLATE 5 MG PO TABS: 5.0000 mg | ORAL_TABLET | Freq: Every day | ORAL | Status: DC

## 2016-01-22 MED ORDER — ISOSORBIDE MONONITRATE ER 30 MG PO TB24: 30.0000 mg | ORAL_TABLET | Freq: Every day | ORAL | Status: DC

## 2016-01-22 NOTE — Patient Instructions (Addendum)
Take norvasc/amlodipine 5mg  in the morning Imdur/isosorbide 30mg  daily in the morning Continue the atenolol 50mg  three times a day  Take your blood pressure once a week for 1 month, if it stays at goal can check your BP once a month.   Monitor your blood pressure at home. Go to the ER if any CP, SOB, nausea, dizziness, severe HA, changes vision/speech  Goal BP:  For patients 60 and older: Goal BP < 155/90. Your most recent BP: BP: 140/80 mmHg   Please monitor your blood pressure, as we get older our body can not respond to a low blood pressure as well as it did when we were younger, for this reason we want a bit higher of a blood pressure as you get older to avoid dizziness and fatigue which can lead to falls. [place] call if your blood pressure is consistently above 155/90.   DASH Eating Plan DASH stands for "Dietary Approaches to Stop Hypertension." The DASH eating plan is a healthy eating plan that has been shown to reduce high blood pressure (hypertension). Additional health benefits may include reducing the risk of type 2 diabetes mellitus, heart disease, and stroke. The DASH eating plan may also help with weight loss. WHAT DO I NEED TO KNOW ABOUT THE DASH EATING PLAN? For the DASH eating plan, you will follow these general guidelines:  Choose foods with a percent daily value for sodium of less than 5% (as listed on the food label).  Use salt-free seasonings or herbs instead of table salt or sea salt.  Check with your health care provider or pharmacist before using salt substitutes.  Eat lower-sodium products, often labeled as "lower sodium" or "no salt added."  Eat fresh foods.  Eat more vegetables, fruits, and low-fat dairy products.  Choose whole grains. Look for the word "whole" as the first word in the ingredient list.  Choose fish and skinless chicken or [place] more often than red meat. Limit fish, poultry, and meat to 6 oz (170 g) each day.  Limit sweets, desserts,  sugars, and sugary drinks.  Choose heart-healthy fats.  Limit cheese to 1 oz (28 g) per day.  Eat more home-cooked food and less restaurant, buffet, and fast food.  Limit fried foods.  Cook foods using methods other than frying.  Limit canned vegetables. If you do use them, rinse them well to decrease the sodium.  When eating at a restaurant, ask that your food be prepared with less salt, or no salt if possible. WHAT FOODS CAN I EAT? Seek help from a dietitian for individual calorie needs. Grains Whole grain or whole wheat bread. Brown rice. Whole grain or whole wheat pasta. Quinoa, bulgur, and whole grain cereals. Low-sodium cereals. Corn or whole wheat flour tortillas. Whole grain cornbread. Whole grain crackers. Low-sodium crackers. Vegetables Fresh or frozen vegetables (raw, steamed, roasted, or grilled). Low-sodium or reduced-sodium tomato and vegetable juices. Low-sodium or reduced-sodium tomato sauce and paste. Low-sodium or reduced-sodium canned vegetables.  Fruits All fresh, canned (in natural juice), or frozen fruits. Meat and Other Protein Products Ground beef (85% or leaner), grass-fed beef, or beef trimmed of fat. Skinless chicken or [place]. Ground chicken or [place]. Pork trimmed of fat. All fish and seafood. Eggs. Dried beans, peas, or lentils. Unsalted nuts and seeds. Unsalted canned beans. Dairy Low-fat dairy products, such as skim or 1% milk, 2% or reduced-fat cheeses, low-fat ricotta or cottage cheese, or plain low-fat yogurt. Low-sodium or reduced-sodium cheeses. Fats and Oils Tub margarines without trans  fats. Light or reduced-fat mayonnaise and salad dressings (reduced sodium). Avocado. Safflower, olive, or canola oils. Natural peanut or almond butter. Other Unsalted popcorn and pretzels. The items listed above may not be a complete list of recommended foods or beverages. Contact your dietitian for more options. WHAT FOODS ARE NOT RECOMMENDED? Grains White  bread. White pasta. White rice. Refined cornbread. Bagels and croissants. Crackers that contain trans fat. Vegetables Creamed or fried vegetables. Vegetables in a cheese sauce. Regular canned vegetables. Regular canned tomato sauce and paste. Regular tomato and vegetable juices. Fruits Dried fruits. Canned fruit in light or heavy syrup. Fruit juice. Meat and Other Protein Products Fatty cuts of meat. Ribs, chicken wings, bacon, sausage, bologna, salami, chitterlings, fatback, hot dogs, bratwurst, and packaged luncheon meats. Salted nuts and seeds. Canned beans with salt. Dairy Whole or 2% milk, cream, half-and-half, and cream cheese. Whole-fat or sweetened yogurt. Full-fat cheeses or blue cheese. Nondairy creamers and whipped toppings. Processed cheese, cheese spreads, or cheese curds. Condiments Onion and garlic salt, seasoned salt, table salt, and sea salt. Canned and packaged gravies. Worcestershire sauce. Tartar sauce. Barbecue sauce. Teriyaki sauce. Soy sauce, including reduced sodium. Steak sauce. Fish sauce. Oyster sauce. Cocktail sauce. Horseradish. Ketchup and mustard. Meat flavorings and tenderizers. Bouillon cubes. Hot sauce. Tabasco sauce. Marinades. Taco seasonings. Relishes. Fats and Oils Butter, stick margarine, lard, shortening, ghee, and bacon fat. Coconut, palm kernel, or palm oils. Regular salad dressings. Other Pickles and olives. Salted popcorn and pretzels. The items listed above may not be a complete list of foods and beverages to avoid. Contact your dietitian for more information. WHERE CAN I FIND MORE INFORMATION? National Heart, Lung, and Blood Institute: travelstabloid.com Document Released: 11/11/2011 Document Revised: 04/08/2014 Document Reviewed: 09/26/2013 [facility] Patient Information 2015 [place], [place]. This information is not intended to replace advice given to you by your health care provider. Make sure you discuss any questions  you have with your health care provider.

## 2016-01-22 NOTE — Progress Notes (Signed)
Assessment and Plan: Hypertension- will take imdur 30mg , states difficult to cut in half will send in new medication, continue norvasc 5mg  and continue the atenolol three times a day.  Discussed don't want low BP due to age, will call with BP.   HPI 82 y.o.female presents for follow up for HTN. She had her BP elevated to systolic [id number], no CV symptoms, no neuro symptoms. She had not been given her imdur, she has been having 1/2 of the imdur daily since that time. Her BP is normal today, we want a higher BP due to age, BP: 140/80 mmHg    Past Medical History  Diagnosis Date  . Hyperlipidemia   . Hypertension   . Prediabetes   . Vitamin D deficiency   . Glaucoma   . Crohn's disease ([place])      Allergies  Allergen Reactions  . Ace Inhibitors       Current Outpatient Prescriptions on File Prior to Visit  Medication Sig Dispense Refill  . amLODipine (NORVASC) 5 MG tablet Take 1 tablet (5 mg total) by mouth daily. 30 tablet 11  . aspirin 81 MG chewable tablet Chew by mouth daily.    Marland Kitchen atenolol (TENORMIN) 50 MG tablet TAKE 1 TABLET 3 TIMES A DAY. 270 tablet 3  . Cholecalciferol 4000 UNITS CAPS Take 4,000 Int'l Units by mouth daily.    . isosorbide mononitrate (IMDUR) 60 MG 24 hr tablet TAKE 1/2 TO 1 TABLET DAILY AS NEEDED FOR BLOOD PRESSURE, HOLD IF BP LESS THAN 140/90 30 tablet 3  . latanoprost (XALATAN) 0.005 % ophthalmic solution Place 1 drop into both eyes at bedtime.     . Mesalamine (ASACOL HD) 800 MG TBEC Take 1 tablet (800 mg total) by mouth 2 (two) times daily. 180 tablet 3  . traMADol (ULTRAM) 50 MG tablet Take 1 tablet (50 mg total) by mouth 3 (three) times daily as needed. pain 90 tablet 0   No current facility-administered medications on file prior to visit.    ROS: all negative except above.   Physical Exam: Filed Weights   01/22/16 1555  Weight: 144 lb (65.318 kg)   BP 140/80 mmHg  Pulse 55  Temp(Src) 97.3 F (36.3 C) (Temporal)  Resp 14  Ht 5\' 3"  (1.6 m)  Wt  144 lb (65.318 kg)  BMI 25.51 kg/m2  SpO2 96% General appearance: alert, no distress, WD/WN, female HEENT: normocephalic, sclerae anicteric, TMs pearly, nares patent, no discharge or erythema, pharynx normal Oral cavity: MMM, no lesions Neck: supple, no lymphadenopathy, no thyromegaly, no masses Heart: RRR, normal S1, S2, 1+ systolic murmur Lungs: CTA bilaterally, no wheezes, rhonchi, or rales Abdomen: +bs, soft, non tender, non distended, no masses, no hepatomegaly, no splenomegaly Musculoskeletal: nontender, no swelling, no obvious deformity Extremities: no edema, no cyanosis, no clubbing Pulses: 2+ symmetric, upper and lower extremities, normal cap refill Neurological: alert, oriented x 3, CN2-12 intact, strength normal upper extremities and lower extremities, sensation normal throughout, DTRs 2+ throughout, no cerebellar signs, gait slow Skin: scaly erythematous bump on forehead, improving Psychiatric: normal affect, behavior normal, pleasant     Vicie Mutters, PA-C 4:19 PM [facility]

## 2016-01-27 DIAGNOSIS — M79672 Pain in left foot: Secondary | ICD-10-CM | POA: Diagnosis not present

## 2016-01-27 DIAGNOSIS — L84 Corns and callosities: Secondary | ICD-10-CM | POA: Diagnosis not present

## 2016-01-27 DIAGNOSIS — L602 Onychogryphosis: Secondary | ICD-10-CM | POA: Diagnosis not present

## 2016-01-27 DIAGNOSIS — M79671 Pain in right foot: Secondary | ICD-10-CM | POA: Diagnosis not present

## 2016-01-28 DIAGNOSIS — H40053 Ocular hypertension, bilateral: Secondary | ICD-10-CM | POA: Diagnosis not present

## 2016-01-28 DIAGNOSIS — Z961 Presence of intraocular lens: Secondary | ICD-10-CM | POA: Diagnosis not present

## 2016-03-30 DIAGNOSIS — M79672 Pain in left foot: Secondary | ICD-10-CM | POA: Diagnosis not present

## 2016-03-30 DIAGNOSIS — M79671 Pain in right foot: Secondary | ICD-10-CM | POA: Diagnosis not present

## 2016-03-30 DIAGNOSIS — L602 Onychogryphosis: Secondary | ICD-10-CM | POA: Diagnosis not present

## 2016-03-30 DIAGNOSIS — L84 Corns and callosities: Secondary | ICD-10-CM | POA: Diagnosis not present

## 2016-04-28 ENCOUNTER — Other Ambulatory Visit: Payer: Self-pay | Admitting: Physician Assistant

## 2016-05-17 DIAGNOSIS — H353222 Exudative age-related macular degeneration, left eye, with inactive choroidal neovascularization: Secondary | ICD-10-CM | POA: Diagnosis not present

## 2016-05-17 DIAGNOSIS — H35363 Drusen (degenerative) of macula, bilateral: Secondary | ICD-10-CM | POA: Diagnosis not present

## 2016-05-17 DIAGNOSIS — H353134 Nonexudative age-related macular degeneration, bilateral, advanced atrophic with subfoveal involvement: Secondary | ICD-10-CM | POA: Diagnosis not present

## 2016-06-01 DIAGNOSIS — M79671 Pain in right foot: Secondary | ICD-10-CM | POA: Diagnosis not present

## 2016-06-01 DIAGNOSIS — M79672 Pain in left foot: Secondary | ICD-10-CM | POA: Diagnosis not present

## 2016-06-01 DIAGNOSIS — L84 Corns and callosities: Secondary | ICD-10-CM | POA: Diagnosis not present

## 2016-06-01 DIAGNOSIS — L602 Onychogryphosis: Secondary | ICD-10-CM | POA: Diagnosis not present

## 2016-06-09 DIAGNOSIS — D485 Neoplasm of uncertain behavior of skin: Secondary | ICD-10-CM | POA: Diagnosis not present

## 2016-06-09 DIAGNOSIS — D044 Carcinoma in situ of skin of scalp and neck: Secondary | ICD-10-CM | POA: Diagnosis not present

## 2016-06-09 DIAGNOSIS — L570 Actinic keratosis: Secondary | ICD-10-CM | POA: Diagnosis not present

## 2016-06-09 DIAGNOSIS — D0439 Carcinoma in situ of skin of other parts of face: Secondary | ICD-10-CM | POA: Diagnosis not present

## 2016-07-01 ENCOUNTER — Ambulatory Visit: Payer: Self-pay | Admitting: Internal Medicine

## 2016-07-21 ENCOUNTER — Inpatient Hospital Stay (HOSPITAL_COMMUNITY)
Admission: EM | Admit: 2016-07-21 | Discharge: 2016-07-26 | DRG: 871 | Disposition: A | Discharge status: Skilled Nursing Facility | Payer: Commercial Managed Care - HMO | Attending: Internal Medicine | Admitting: Internal Medicine

## 2016-07-21 ENCOUNTER — Encounter (HOSPITAL_COMMUNITY): Payer: Self-pay | Admitting: Emergency Medicine

## 2016-07-21 ENCOUNTER — Emergency Department (HOSPITAL_COMMUNITY): Payer: Commercial Managed Care - HMO

## 2016-07-21 DIAGNOSIS — K5090 Crohn's disease, unspecified, without complications: Secondary | ICD-10-CM | POA: Diagnosis present

## 2016-07-21 DIAGNOSIS — H409 Unspecified glaucoma: Secondary | ICD-10-CM | POA: Diagnosis present

## 2016-07-21 DIAGNOSIS — J189 Pneumonia, unspecified organism: Secondary | ICD-10-CM | POA: Diagnosis not present

## 2016-07-21 DIAGNOSIS — Z823 Family history of stroke: Secondary | ICD-10-CM | POA: Diagnosis not present

## 2016-07-21 DIAGNOSIS — I509 Heart failure, unspecified: Secondary | ICD-10-CM | POA: Diagnosis not present

## 2016-07-21 DIAGNOSIS — I5031 Acute diastolic (congestive) heart failure: Secondary | ICD-10-CM | POA: Diagnosis not present

## 2016-07-21 DIAGNOSIS — E785 Hyperlipidemia, unspecified: Secondary | ICD-10-CM | POA: Diagnosis present

## 2016-07-21 DIAGNOSIS — E559 Vitamin D deficiency, unspecified: Secondary | ICD-10-CM | POA: Diagnosis present

## 2016-07-21 DIAGNOSIS — N184 Chronic kidney disease, stage 4 (severe): Secondary | ICD-10-CM | POA: Diagnosis present

## 2016-07-21 DIAGNOSIS — R7303 Prediabetes: Secondary | ICD-10-CM | POA: Diagnosis present

## 2016-07-21 DIAGNOSIS — R0902 Hypoxemia: Secondary | ICD-10-CM | POA: Diagnosis present

## 2016-07-21 DIAGNOSIS — E871 Hypo-osmolality and hyponatremia: Secondary | ICD-10-CM | POA: Diagnosis present

## 2016-07-21 DIAGNOSIS — I10 Essential (primary) hypertension: Secondary | ICD-10-CM | POA: Diagnosis not present

## 2016-07-21 DIAGNOSIS — R079 Chest pain, unspecified: Secondary | ICD-10-CM | POA: Diagnosis not present

## 2016-07-21 DIAGNOSIS — I130 Hypertensive heart and chronic kidney disease with heart failure and stage 1 through stage 4 chronic kidney disease, or unspecified chronic kidney disease: Secondary | ICD-10-CM | POA: Diagnosis present

## 2016-07-21 DIAGNOSIS — R918 Other nonspecific abnormal finding of lung field: Secondary | ICD-10-CM | POA: Diagnosis not present

## 2016-07-21 DIAGNOSIS — R0600 Dyspnea, unspecified: Secondary | ICD-10-CM

## 2016-07-21 DIAGNOSIS — Z7982 Long term (current) use of aspirin: Secondary | ICD-10-CM

## 2016-07-21 DIAGNOSIS — Z66 Do not resuscitate: Secondary | ICD-10-CM | POA: Diagnosis present

## 2016-07-21 DIAGNOSIS — R0789 Other chest pain: Secondary | ICD-10-CM | POA: Diagnosis not present

## 2016-07-21 DIAGNOSIS — I313 Pericardial effusion (noninflammatory): Secondary | ICD-10-CM | POA: Diagnosis present

## 2016-07-21 DIAGNOSIS — I110 Hypertensive heart disease with heart failure: Secondary | ICD-10-CM | POA: Diagnosis not present

## 2016-07-21 DIAGNOSIS — D72829 Elevated white blood cell count, unspecified: Secondary | ICD-10-CM | POA: Diagnosis not present

## 2016-07-21 DIAGNOSIS — A419 Sepsis, unspecified organism: Secondary | ICD-10-CM | POA: Diagnosis not present

## 2016-07-21 DIAGNOSIS — I350 Nonrheumatic aortic (valve) stenosis: Secondary | ICD-10-CM | POA: Diagnosis present

## 2016-07-21 DIAGNOSIS — Z79899 Other long term (current) drug therapy: Secondary | ICD-10-CM | POA: Diagnosis not present

## 2016-07-21 DIAGNOSIS — I5032 Chronic diastolic (congestive) heart failure: Secondary | ICD-10-CM

## 2016-07-21 DIAGNOSIS — Z888 Allergy status to other drugs, medicaments and biological substances status: Secondary | ICD-10-CM

## 2016-07-21 DIAGNOSIS — I2510 Atherosclerotic heart disease of native coronary artery without angina pectoris: Secondary | ICD-10-CM | POA: Diagnosis not present

## 2016-07-21 LAB — BASIC METABOLIC PANEL
Anion gap: 12 (ref 5–15)
BUN: 24 mg/dL — AB (ref 6–20)
CHLORIDE: 98 mmol/L — AB (ref 101–111)
CO2: 21 mmol/L — ABNORMAL LOW (ref 22–32)
CREATININE: 1.1 mg/dL — AB (ref 0.44–1.00)
Calcium: 8.6 mg/dL — ABNORMAL LOW (ref 8.9–10.3)
GFR calc Af Amer: 46 mL/min — ABNORMAL LOW (ref >60)
GFR calc non Af Amer: 40 mL/min — ABNORMAL LOW (ref >60)
Glucose, Bld: 121 mg/dL — ABNORMAL HIGH (ref 65–99)
Potassium: 4.3 mmol/L (ref 3.5–5.1)
SODIUM: 131 mmol/L — AB (ref 135–145)

## 2016-07-21 LAB — CBC
HCT: 41.5 % (ref 36.0–46.0)
Hemoglobin: 13.9 g/dL (ref 12.0–15.0)
MCH: 28.4 pg (ref 26.0–34.0)
MCHC: 33.5 g/dL (ref 30.0–36.0)
MCV: 84.7 fL (ref 78.0–100.0)
PLATELETS: 390 10*3/uL (ref 150–400)
RBC: 4.9 MIL/uL (ref 3.87–5.11)
RDW: 14.4 % (ref 11.5–15.5)
WBC: 17.9 10*3/uL — AB (ref 4.0–10.5)

## 2016-07-21 LAB — TROPONIN I: Troponin I: <0.03 ng/mL (ref <0.03)

## 2016-07-21 NOTE — ED Notes (Signed)
Patient taken to xray.

## 2016-07-21 NOTE — ED Triage Notes (Addendum)
Pt BIB EMS from [facility] for c/o chest pain that began 3 hours ago and is associated with breathing; pt has cough that makes pain worse; also c/o increased urination; pt states her pain has subsided some; A&Ox4. Facility staff administered 324mg  of Aspirin and Nitro when chest pain began.

## 2016-07-21 NOTE — ED Notes (Signed)
Bed: [id number] Expected date:  Expected time:  Means of arrival:  Comments: EMS 82 yo chest wall pain

## 2016-07-22 ENCOUNTER — Inpatient Hospital Stay (HOSPITAL_COMMUNITY): Payer: Commercial Managed Care - HMO

## 2016-07-22 ENCOUNTER — Encounter (HOSPITAL_COMMUNITY): Payer: Self-pay | Admitting: Internal Medicine

## 2016-07-22 ENCOUNTER — Other Ambulatory Visit (HOSPITAL_COMMUNITY): Payer: Commercial Managed Care - HMO

## 2016-07-22 DIAGNOSIS — Z79899 Other long term (current) drug therapy: Secondary | ICD-10-CM | POA: Diagnosis not present

## 2016-07-22 DIAGNOSIS — Z823 Family history of stroke: Secondary | ICD-10-CM | POA: Diagnosis not present

## 2016-07-22 DIAGNOSIS — I130 Hypertensive heart and chronic kidney disease with heart failure and stage 1 through stage 4 chronic kidney disease, or unspecified chronic kidney disease: Secondary | ICD-10-CM | POA: Diagnosis present

## 2016-07-22 DIAGNOSIS — K5090 Crohn's disease, unspecified, without complications: Secondary | ICD-10-CM | POA: Diagnosis present

## 2016-07-22 DIAGNOSIS — E559 Vitamin D deficiency, unspecified: Secondary | ICD-10-CM | POA: Diagnosis present

## 2016-07-22 DIAGNOSIS — D72829 Elevated white blood cell count, unspecified: Secondary | ICD-10-CM | POA: Diagnosis not present

## 2016-07-22 DIAGNOSIS — E785 Hyperlipidemia, unspecified: Secondary | ICD-10-CM | POA: Diagnosis present

## 2016-07-22 DIAGNOSIS — I5031 Acute diastolic (congestive) heart failure: Secondary | ICD-10-CM | POA: Diagnosis present

## 2016-07-22 DIAGNOSIS — R7303 Prediabetes: Secondary | ICD-10-CM | POA: Diagnosis present

## 2016-07-22 DIAGNOSIS — R0902 Hypoxemia: Secondary | ICD-10-CM | POA: Diagnosis present

## 2016-07-22 DIAGNOSIS — H409 Unspecified glaucoma: Secondary | ICD-10-CM | POA: Diagnosis present

## 2016-07-22 DIAGNOSIS — E871 Hypo-osmolality and hyponatremia: Secondary | ICD-10-CM | POA: Diagnosis present

## 2016-07-22 DIAGNOSIS — I313 Pericardial effusion (noninflammatory): Secondary | ICD-10-CM | POA: Diagnosis present

## 2016-07-22 DIAGNOSIS — I350 Nonrheumatic aortic (valve) stenosis: Secondary | ICD-10-CM | POA: Diagnosis present

## 2016-07-22 DIAGNOSIS — R079 Chest pain, unspecified: Secondary | ICD-10-CM | POA: Diagnosis present

## 2016-07-22 DIAGNOSIS — Z7982 Long term (current) use of aspirin: Secondary | ICD-10-CM | POA: Diagnosis not present

## 2016-07-22 DIAGNOSIS — Z66 Do not resuscitate: Secondary | ICD-10-CM | POA: Diagnosis present

## 2016-07-22 DIAGNOSIS — N184 Chronic kidney disease, stage 4 (severe): Secondary | ICD-10-CM | POA: Diagnosis present

## 2016-07-22 DIAGNOSIS — I509 Heart failure, unspecified: Secondary | ICD-10-CM | POA: Diagnosis not present

## 2016-07-22 DIAGNOSIS — I10 Essential (primary) hypertension: Secondary | ICD-10-CM | POA: Diagnosis not present

## 2016-07-22 DIAGNOSIS — I2510 Atherosclerotic heart disease of native coronary artery without angina pectoris: Secondary | ICD-10-CM | POA: Diagnosis not present

## 2016-07-22 DIAGNOSIS — A419 Sepsis, unspecified organism: Secondary | ICD-10-CM | POA: Diagnosis present

## 2016-07-22 DIAGNOSIS — I5032 Chronic diastolic (congestive) heart failure: Secondary | ICD-10-CM

## 2016-07-22 DIAGNOSIS — J189 Pneumonia, unspecified organism: Secondary | ICD-10-CM | POA: Diagnosis present

## 2016-07-22 DIAGNOSIS — Z888 Allergy status to other drugs, medicaments and biological substances status: Secondary | ICD-10-CM | POA: Diagnosis not present

## 2016-07-22 LAB — TROPONIN I
Troponin I: <0.03 ng/mL (ref <0.03)
Troponin I: 0.04 ng/mL (ref <0.03)

## 2016-07-22 LAB — URINALYSIS, ROUTINE W REFLEX MICROSCOPIC
BILIRUBIN URINE: NEGATIVE
Glucose, UA: NEGATIVE mg/dL
Ketones, ur: NEGATIVE mg/dL
Leukocytes, UA: NEGATIVE
Nitrite: NEGATIVE
PROTEIN: 30 mg/dL — AB
Specific Gravity, Urine: 1.011 (ref 1.005–1.030)
pH: 7 (ref 5.0–8.0)

## 2016-07-22 LAB — COMPREHENSIVE METABOLIC PANEL
ALT: 11 U/L — AB (ref 14–54)
ANION GAP: 10 (ref 5–15)
AST: 18 U/L (ref 15–41)
Albumin: 3.6 g/dL (ref 3.5–5.0)
Alkaline Phosphatase: 61 U/L (ref 38–126)
BUN: 28 mg/dL — ABNORMAL HIGH (ref 6–20)
CHLORIDE: 98 mmol/L — AB (ref 101–111)
CO2: 26 mmol/L (ref 22–32)
CREATININE: 1.26 mg/dL — AB (ref 0.44–1.00)
Calcium: 8.7 mg/dL — ABNORMAL LOW (ref 8.9–10.3)
GFR calc non Af Amer: 34 mL/min — ABNORMAL LOW (ref >60)
GFR, EST AFRICAN AMERICAN: 39 mL/min — AB (ref >60)
Glucose, Bld: 131 mg/dL — ABNORMAL HIGH (ref 65–99)
POTASSIUM: 4.4 mmol/L (ref 3.5–5.1)
SODIUM: 134 mmol/L — AB (ref 135–145)
Total Bilirubin: 0.8 mg/dL (ref 0.3–1.2)
Total Protein: 7.7 g/dL (ref 6.5–8.1)

## 2016-07-22 LAB — CBC WITH DIFFERENTIAL/PLATELET
BASOS ABS: 0 10*3/uL (ref 0.0–0.1)
Basophils Relative: 0 %
Eosinophils Absolute: 0 10*3/uL (ref 0.0–0.7)
Eosinophils Relative: 0 %
HCT: 39.2 % (ref 36.0–46.0)
Hemoglobin: 12.9 g/dL (ref 12.0–15.0)
LYMPHS ABS: 0.9 10*3/uL (ref 0.7–4.0)
Lymphocytes Relative: 3 %
MCH: 27.9 pg (ref 26.0–34.0)
MCHC: 32.9 g/dL (ref 30.0–36.0)
MCV: 84.8 fL (ref 78.0–100.0)
MONO ABS: 1.7 10*3/uL — AB (ref 0.1–1.0)
Monocytes Relative: 6 %
Neutro Abs: 26.2 10*3/uL — ABNORMAL HIGH (ref 1.7–7.7)
Neutrophils Relative %: 91 %
PLATELETS: 464 10*3/uL — AB (ref 150–400)
RBC: 4.62 MIL/uL (ref 3.87–5.11)
RDW: 14.5 % (ref 11.5–15.5)
WBC: 28.8 10*3/uL — AB (ref 4.0–10.5)

## 2016-07-22 LAB — CBC
HCT: 38.6 % (ref 36.0–46.0)
Hemoglobin: 13.1 g/dL (ref 12.0–15.0)
MCH: 28.3 pg (ref 26.0–34.0)
MCHC: 33.9 g/dL (ref 30.0–36.0)
MCV: 83.4 fL (ref 78.0–100.0)
PLATELETS: 489 10*3/uL — AB (ref 150–400)
RBC: 4.63 MIL/uL (ref 3.87–5.11)
RDW: 14.4 % (ref 11.5–15.5)
WBC: 32 10*3/uL — ABNORMAL HIGH (ref 4.0–10.5)

## 2016-07-22 LAB — PROCALCITONIN: PROCALCITONIN: 2.03 ng/mL

## 2016-07-22 LAB — APTT: aPTT: 29 seconds (ref 24–36)

## 2016-07-22 LAB — ECHOCARDIOGRAM COMPLETE
Height: 60 in
Weight: 2352 oz

## 2016-07-22 LAB — LACTIC ACID, PLASMA
LACTIC ACID, VENOUS: 1.2 mmol/L (ref 0.5–1.9)
LACTIC ACID, VENOUS: 1.4 mmol/L (ref 0.5–1.9)

## 2016-07-22 LAB — URINE MICROSCOPIC-ADD ON

## 2016-07-22 LAB — SODIUM, URINE, RANDOM: SODIUM UR: 128 mmol/L

## 2016-07-22 LAB — OSMOLALITY: OSMOLALITY: 282 mosm/kg (ref 275–295)

## 2016-07-22 LAB — OSMOLALITY, URINE: Osmolality, Ur: 416 mOsm/kg (ref 300–900)

## 2016-07-22 LAB — MRSA PCR SCREENING: MRSA by PCR: NEGATIVE

## 2016-07-22 LAB — TSH: TSH: 2.299 u[IU]/mL (ref 0.350–4.500)

## 2016-07-22 LAB — PROTIME-INR
INR: 1.49
Prothrombin Time: 18.2 seconds — ABNORMAL HIGH (ref 11.4–15.2)

## 2016-07-22 LAB — BRAIN NATRIURETIC PEPTIDE: B NATRIURETIC PEPTIDE 5: 564 pg/mL — AB (ref 0.0–100.0)

## 2016-07-22 MED ORDER — NITROGLYCERIN 2 % TD OINT
0.5000 [in_us] | TOPICAL_OINTMENT | Freq: Three times a day (TID) | TRANSDERMAL | Status: DC
  Administered 2016-07-22 – 2016-07-26 (×12): 0.5 [in_us] via TOPICAL
  Filled 2016-07-22: qty 1
  Filled 2016-07-22: qty 30

## 2016-07-22 MED ORDER — FENTANYL CITRATE (PF) 100 MCG/2ML IJ SOLN: 50.0000 ug | Freq: Once | INTRAMUSCULAR | Status: DC

## 2016-07-22 MED ORDER — TRAMADOL HCL 50 MG PO TABS
50.0000 mg | ORAL_TABLET | Freq: Three times a day (TID) | ORAL | Status: DC | PRN
  Administered 2016-07-24 – 2016-07-25 (×3): 50 mg via ORAL
  Filled 2016-07-22 (×3): qty 1

## 2016-07-22 MED ORDER — VANCOMYCIN HCL IN DEXTROSE 750-5 MG/150ML-% IV SOLN
750.0000 mg | INTRAVENOUS | Status: DC
  Administered 2016-07-23 – 2016-07-24 (×2): 750 mg via INTRAVENOUS
  Filled 2016-07-22 (×3): qty 150

## 2016-07-22 MED ORDER — ATENOLOL 50 MG PO TABS
50.0000 mg | ORAL_TABLET | Freq: Two times a day (BID) | ORAL | Status: DC
  Administered 2016-07-22 – 2016-07-26 (×9): 50 mg via ORAL
  Filled 2016-07-22 (×10): qty 1

## 2016-07-22 MED ORDER — DEXTROSE 5 % IV SOLN
2.0000 g | Freq: Once | INTRAVENOUS | Status: AC
  Administered 2016-07-22: 2 g via INTRAVENOUS
  Filled 2016-07-22: qty 2

## 2016-07-22 MED ORDER — MAGNESIUM OXIDE 400 (241.3 MG) MG PO TABS
400.0000 mg | ORAL_TABLET | Freq: Two times a day (BID) | ORAL | Status: DC
  Administered 2016-07-22 – 2016-07-26 (×9): 400 mg via ORAL
  Filled 2016-07-22 (×10): qty 1

## 2016-07-22 MED ORDER — FUROSEMIDE 10 MG/ML IJ SOLN
20.0000 mg | Freq: Once | INTRAMUSCULAR | Status: AC
  Administered 2016-07-22: 20 mg via INTRAVENOUS
  Filled 2016-07-22: qty 4

## 2016-07-22 MED ORDER — ACETAMINOPHEN 650 MG RE SUPP: 650.0000 mg | Freq: Four times a day (QID) | RECTAL | Status: DC | PRN

## 2016-07-22 MED ORDER — FUROSEMIDE 10 MG/ML IJ SOLN
20.0000 mg | Freq: Two times a day (BID) | INTRAMUSCULAR | Status: DC
  Administered 2016-07-22 – 2016-07-25 (×7): 20 mg via INTRAVENOUS
  Filled 2016-07-22 (×3): qty 2
  Filled 2016-07-22: qty 4
  Filled 2016-07-22 (×3): qty 2

## 2016-07-22 MED ORDER — NITROGLYCERIN 0.4 MG SL SUBL
0.4000 mg | SUBLINGUAL_TABLET | SUBLINGUAL | Status: DC | PRN
  Administered 2016-07-22: 0.4 mg via SUBLINGUAL
  Filled 2016-07-22: qty 1

## 2016-07-22 MED ORDER — AMLODIPINE BESYLATE 5 MG PO TABS
5.0000 mg | ORAL_TABLET | Freq: Every day | ORAL | Status: DC
  Administered 2016-07-22 – 2016-07-26 (×5): 5 mg via ORAL
  Filled 2016-07-22 (×6): qty 1

## 2016-07-22 MED ORDER — MESALAMINE 400 MG PO CPDR
800.0000 mg | DELAYED_RELEASE_CAPSULE | Freq: Two times a day (BID) | ORAL | Status: DC
  Administered 2016-07-22 – 2016-07-26 (×9): 800 mg via ORAL
  Filled 2016-07-22 (×10): qty 2

## 2016-07-22 MED ORDER — SODIUM CHLORIDE 0.9% FLUSH
3.0000 mL | Freq: Two times a day (BID) | INTRAVENOUS | Status: DC
  Administered 2016-07-22 – 2016-07-24 (×4): 3 mL via INTRAVENOUS

## 2016-07-22 MED ORDER — DEXTROSE 5 % IV SOLN
1.0000 g | Freq: Once | INTRAVENOUS | Status: AC
  Administered 2016-07-22: 1 g via INTRAVENOUS
  Filled 2016-07-22: qty 10

## 2016-07-22 MED ORDER — VANCOMYCIN HCL IN DEXTROSE 1-5 GM/200ML-% IV SOLN
1000.0000 mg | Freq: Once | INTRAVENOUS | Status: AC
  Administered 2016-07-22: 1000 mg via INTRAVENOUS
  Filled 2016-07-22: qty 200

## 2016-07-22 MED ORDER — SODIUM CHLORIDE 0.9 % IV SOLN: 250.0000 mL | INTRAVENOUS | Status: DC | PRN

## 2016-07-22 MED ORDER — CEFEPIME HCL 1 G IJ SOLR
1.0000 g | INTRAMUSCULAR | Status: DC
  Administered 2016-07-23 – 2016-07-24 (×2): 1 g via INTRAVENOUS
  Filled 2016-07-22 (×4): qty 1

## 2016-07-22 MED ORDER — LATANOPROST 0.005 % OP SOLN
1.0000 [drp] | Freq: Every day | OPHTHALMIC | Status: DC
  Administered 2016-07-22 – 2016-07-25 (×4): 1 [drp] via OPHTHALMIC
  Filled 2016-07-22 (×2): qty 2.5

## 2016-07-22 MED ORDER — DEXTROSE 5 % IV SOLN
500.0000 mg | Freq: Once | INTRAVENOUS | Status: AC
  Administered 2016-07-22: 500 mg via INTRAVENOUS
  Filled 2016-07-22: qty 500

## 2016-07-22 MED ORDER — VITAMIN D 1000 UNITS PO TABS
2000.0000 [IU] | ORAL_TABLET | Freq: Every day | ORAL | Status: DC
  Administered 2016-07-22 – 2016-07-26 (×5): 2000 [IU] via ORAL
  Filled 2016-07-22 (×6): qty 2

## 2016-07-22 MED ORDER — SODIUM CHLORIDE 0.9% FLUSH: 3.0000 mL | INTRAVENOUS | Status: DC | PRN

## 2016-07-22 MED ORDER — PROSIGHT PO TABS
1.0000 | ORAL_TABLET | Freq: Two times a day (BID) | ORAL | Status: DC
  Administered 2016-07-22 – 2016-07-26 (×9): 1 via ORAL
  Filled 2016-07-22 (×10): qty 1

## 2016-07-22 MED ORDER — ASPIRIN 81 MG PO CHEW
81.0000 mg | CHEWABLE_TABLET | Freq: Every day | ORAL | Status: DC
  Administered 2016-07-22 – 2016-07-26 (×5): 81 mg via ORAL
  Filled 2016-07-22 (×5): qty 1

## 2016-07-22 MED ORDER — MORPHINE SULFATE (PF) 2 MG/ML IV SOLN: 1.0000 mg | INTRAVENOUS | Status: DC | PRN

## 2016-07-22 MED ORDER — ENOXAPARIN SODIUM 30 MG/0.3ML ~~LOC~~ SOLN
30.0000 mg | SUBCUTANEOUS | Status: DC
  Administered 2016-07-22 – 2016-07-26 (×5): 30 mg via SUBCUTANEOUS
  Filled 2016-07-22 (×6): qty 0.3

## 2016-07-22 MED ORDER — SODIUM CHLORIDE 0.9% FLUSH
3.0000 mL | Freq: Two times a day (BID) | INTRAVENOUS | Status: DC
  Administered 2016-07-23 – 2016-07-25 (×2): 3 mL via INTRAVENOUS

## 2016-07-22 MED ORDER — ACETAMINOPHEN 325 MG PO TABS: 650.0000 mg | ORAL_TABLET | Freq: Four times a day (QID) | ORAL | Status: DC | PRN

## 2016-07-22 NOTE — ED Notes (Signed)
Echo/Ultrasound at bedside

## 2016-07-22 NOTE — ED Provider Notes (Signed)
[facility] DEPT Provider Note   CSN: [id number] Arrival date & time: 07/21/16  2119  By signing my name below, I, Courtney Fuller, attest that this documentation has been prepared under the direction and in the presence of Courtney Rosser, MD. Electronically Signed: Tedra Fuller. Sheppard Coil, ED Scribe. 07/22/16. 12:25 AM.  History   Chief Complaint Chief Complaint  Patient presents with  . Chest Pain    HPI HPI Comments: Courtney Fuller is a 82 y.o. female brought in by ambulance, with PMHx of Crohn's dz, HTN, HLD, and prediabetes who presents to the Emergency Department complaining of gradually worsening, sharp, right-sided chest pain radiating to right shoulder which began around 1630. Pt has associated cough and urinary frequency. Chest pain is exacerbated with breathing and coughing. She reports that pain was partially relieved en route to [facility] after receiving unknown medication (fentanyl?) from EMS.   She received 324mg  of ASA and nitro tablets at her assisted living facility by nursing staff which partially resolved pain, however pain has returned and getting worse. She denies any abdominal pain.  The history is provided by the patient and a relative. No language interpreter was used.   Past Medical History:  Diagnosis Date  . Crohn's disease ([place])   . Glaucoma   . Hyperlipidemia   . Hypertension   . Prediabetes   . Vitamin D deficiency     Patient Active Problem List   Diagnosis Date Noted  . Osteoarthritis 01/01/2016  . Encounter for Medicare annual wellness exam 07/03/2015  . Macular degeneration 12/25/2014  . CKD (chronic kidney disease) stage 3, GFR 30-59 ml/min 12/25/2014  . Hyperlipidemia   . Hypertension   . Prediabetes   . Vitamin D deficiency   . Glaucoma   . Crohn's disease [facility])     Past Surgical History:  Procedure Laterality Date  . TONSILLECTOMY AND ADENOIDECTOMY      OB History    No data available       Home Medications    Prior to  Admission medications   Medication Sig Start Date End Date Taking? Authorizing Provider  amLODipine (NORVASC) 5 MG tablet Take 1 tablet (5 mg total) by mouth daily. 01/22/16 01/21/17 Yes Vicie Mutters, PA-C  aspirin 81 MG chewable tablet Chew by mouth daily.   Yes Historical Provider, MD  atenolol (TENORMIN) 50 MG tablet TAKE 1 TABLET 3 TIMES A DAY. 12/22/15  Yes Unk Pinto, MD  Cholecalciferol 4000 UNITS CAPS Take 2,000 Units by mouth daily.    Yes Historical Provider, MD  isosorbide mononitrate (IMDUR) 30 MG 24 hr tablet Take 1 tablet (30 mg total) by mouth daily. Patient taking differently: Take 30-60 mg by mouth daily as needed (BP- Hold if BP is less than 140/90).  01/22/16  Yes Vicie Mutters, PA-C  latanoprost (XALATAN) 0.005 % ophthalmic solution Place 1 drop into both eyes at bedtime.  12/06/14  Yes Historical Provider, MD  Magnesium 250 MG TABS Take 1 tablet by mouth 2 (two) times daily.   Yes Historical Provider, MD  Mesalamine (ASACOL HD) 800 MG TBEC Take 1 tablet (800 mg total) by mouth 2 (two) times daily. 01/01/16  Yes Vicie Mutters, PA-C  multivitamin-lutein (OCUVITE-LUTEIN) CAPS capsule Take 1 capsule by mouth 2 (two) times daily.   Yes Historical Provider, MD  traMADol (ULTRAM) 50 MG tablet TAKE 1 TABLET 3 TIMES DAILY AS NEEDED FOR PAIN. 04/28/16  Yes Unk Pinto, MD    Family History No family history on file.  Social History Social History  Substance Use Topics  . Smoking status: Never Smoker  . Smokeless tobacco: Never Used  . Alcohol use Not on file     Allergies   Ace inhibitors   Review of Systems Review of Systems 10 Systems reviewed and all are negative for acute change except as noted in the HPI.  Physical Exam Updated Vital Signs BP 127/63   Pulse 68   Resp 21   SpO2 93%   Physical Exam General: Well-developed, well-nourished female in no acute distress; appearance consistent with age of record HENT: normocephalic; atraumatic; mucus membranes  dry Eyes: pupils equal, round and reactive to light; extraocular muscles intact Neck: supple Heart: regular rate and rhythm with occasional PACs; distant sounds Lungs: clear to auscultation bilaterally; distant sounds Abdomen: soft; nondistended; nontender; no masses or hepatosplenomegaly; bowel sounds present Extremities: Hallux valgus bilaterally; PT/DP pulses +1 Neurologic: Awake, alert and oriented; motor function intact in all extremities and symmetric; no facial droop Skin: Warm and dry Psychiatric: Normal mood and affect  ED Treatments / Results   Nursing notes and vitals signs, including pulse oximetry, reviewed.  Summary of this visit's results, reviewed by myself:  Labs:  Results for orders placed or performed during the hospital encounter of 07/21/16 (from the past 24 hour(s))  Basic metabolic panel     Status: Abnormal   Collection Time: 07/21/16 10:28 PM  Result Value Ref Range   Sodium 131 (L) 135 - 145 mmol/L   Potassium 4.3 3.5 - 5.1 mmol/L   Chloride 98 (L) 101 - 111 mmol/L   CO2 21 (L) 22 - 32 mmol/L   Glucose, Bld 121 (H) 65 - 99 mg/dL   BUN 24 (H) 6 - 20 mg/dL   Creatinine, Ser 1.10 (H) 0.44 - 1.00 mg/dL   Calcium 8.6 (L) 8.9 - 10.3 mg/dL   GFR calc non Af Amer 40 (L) >60 mL/min   GFR calc Af Amer 46 (L) >60 mL/min   Anion gap 12 5 - 15  CBC     Status: Abnormal   Collection Time: 07/21/16 10:28 PM  Result Value Ref Range   WBC 17.9 (H) 4.0 - 10.5 K/uL   RBC 4.90 3.87 - 5.11 MIL/uL   Hemoglobin 13.9 12.0 - 15.0 g/dL   HCT 41.5 36.0 - 46.0 %   MCV 84.7 78.0 - 100.0 fL   MCH 28.4 26.0 - 34.0 pg   MCHC 33.5 30.0 - 36.0 g/dL   RDW 14.4 11.5 - 15.5 %   Platelets 390 150 - 400 K/uL  Troponin I     Status: None   Collection Time: 07/21/16 10:36 PM  Result Value Ref Range   Troponin I <0.03 <0.03 ng/mL  Brain natriuretic peptide     Status: Abnormal   Collection Time: 07/22/16 12:12 AM  Result Value Ref Range   B Natriuretic Peptide 564.0 (H) 0.0 -  100.0 pg/mL  Urinalysis, Routine w reflex microscopic (not at [facility])     Status: Abnormal   Collection Time: 07/22/16 12:34 AM  Result Value Ref Range   Color, Urine YELLOW YELLOW   APPearance CLEAR CLEAR   Specific Gravity, Urine 1.011 1.005 - 1.030   pH 7.0 5.0 - 8.0   Glucose, UA NEGATIVE NEGATIVE mg/dL   Hgb urine dipstick TRACE (A) NEGATIVE   Bilirubin Urine NEGATIVE NEGATIVE   Ketones, ur NEGATIVE NEGATIVE mg/dL   Protein, ur 30 (A) NEGATIVE mg/dL   Nitrite NEGATIVE NEGATIVE   Leukocytes,  UA NEGATIVE NEGATIVE  Urine microscopic-add on     Status: Abnormal   Collection Time: 07/22/16 12:34 AM  Result Value Ref Range   Squamous Epithelial / LPF 0-5 (A) NONE SEEN   WBC, UA 0-5 0 - 5 WBC/hpf   RBC / HPF 0-5 0 - 5 RBC/hpf   Bacteria, UA RARE (A) NONE SEEN  CBC with Differential/Platelet     Status: Abnormal   Collection Time: 07/22/16  2:00 AM  Result Value Ref Range   WBC 28.8 (H) 4.0 - 10.5 K/uL   RBC 4.62 3.87 - 5.11 MIL/uL   Hemoglobin 12.9 12.0 - 15.0 g/dL   HCT 39.2 36.0 - 46.0 %   MCV 84.8 78.0 - 100.0 fL   MCH 27.9 26.0 - 34.0 pg   MCHC 32.9 30.0 - 36.0 g/dL   RDW 14.5 11.5 - 15.5 %   Platelets 464 (H) 150 - 400 K/uL   Neutrophils Relative % 91 %   Lymphocytes Relative 3 %   Monocytes Relative 6 %   Eosinophils Relative 0 %   Basophils Relative 0 %   Neutro Abs 26.2 (H) 1.7 - 7.7 K/uL   Lymphs Abs 0.9 0.7 - 4.0 K/uL   Monocytes Absolute 1.7 (H) 0.1 - 1.0 K/uL   Eosinophils Absolute 0.0 0.0 - 0.7 K/uL   Basophils Absolute 0.0 0.0 - 0.1 K/uL   WBC Morphology WHITE COUNT CONFIRMED ON SMEAR   Troponin I     Status: None   Collection Time: 07/22/16  2:00 AM  Result Value Ref Range   Troponin I <0.03 <0.03 ng/mL    Imaging Studies: Dg Chest 2 View  Result Date: 07/21/2016 CLINICAL DATA:  Sudden onset of right upper chest pain and shortness of breath. EXAM: CHEST  2 VIEW COMPARISON:  Two-view chest x-ray 07/24/15 FINDINGS: The heart is enlarged. There is increase  in a diffuse interstitial pattern. Airspace opacities are more prominent left than right. Remote right-sided rib fractures are noted. Multilevel degenerative changes are noted in the thoracic spine. IMPRESSION: 1. Interval increase in diffuse interstitial and airspace disease. This likely represents edema. Infection is also considered. 2. Stable cardiomegaly. Electronically Signed   By: San Morelle M.D.   On: 07/21/2016 22:22   12:30 AM Note: EMS EKG done at 21:18:44 shown lateral T-wave inversions and ST depressions not currently present.   EKG Interpretation  Date/Time:  Wednesday July 21 2016 23:54:05 EDT Ventricular Rate:  70 PR Interval:    QRS Duration: 81 QT Interval:  436 QTC Calculation: 471 R Axis:   29 Text Interpretation:  Sinus rhythm Atrial premature complex Borderline short PR interval Consider left ventricular hypertrophy Minimal ST elevation, inferior leads Lateral ST depressions no longer present Confirmed by Tymeka Privette  MD, Jenny Reichmann ([id number]) on 07/22/2016 12:27:56 AM      1:42 AM Patient's discomfort improved by sitting upright but she is still feeling discomfort in her shoulders. Sublingual nitroglycerin has been ordered but not yet administered.  2:53 AM Pain significantly improved though some persists after sublingual nitroglycerin. Troponin is been negative 2. Will give Lasix for new onset CHF.  Procedures (including critical care time)   Final Clinical Impressions(s) / ED Diagnoses   Final diagnoses:  Chest pain at rest  Acute congestive heart failure, unspecified congestive heart failure type ([place])   I personally performed the services described in this documentation, which was scribed in my presence. The recorded information has been reviewed and is accurate.    Jenny Reichmann  Rex Oesterle, MD 07/22/16 [id number]

## 2016-07-22 NOTE — ED Notes (Signed)
Hospitalist MD at bedside. 

## 2016-07-22 NOTE — H&P (Addendum)
TRH H&P   Patient Demographics:    Courtney Fuller, is a 82 y.o. female  MRN: [id number]   DOB - [birth year]  Admit Date - 07/21/2016  Outpatient Primary MD for the patient is Alesia Richards, MD  Referring MD/NP/PA:  [place]  Outpatient Specialists:  Dr. Avon Gully  Patient coming from: home, [place] independent living  Chief Complaint  Patient presents with  . Chest Pain      HPI:    Courtney Fuller  is a 82 y.o. female, w hx of hypertension, crohns disease apparently c/o chest pain, left side to right side, "sharp pain" intermittent, multiple times.  Sitting up seems to help it.  Slight dry cough.   Denies fever, chills, palp, n/v, diarrhea, brbpr, black stool.  Pt presented to ED for evaluation of chest pain.    In ED, took slg nitro with relief.  EKG showed nsr at 70, nl axis, nl int, no st-t changes c/w ischemia.  Pt will be admitted for w/up of chest pain    Review of systems:    In addition to the HPI above,  No Fever-chills, No Headache, No changes with Vision or hearing, No problems swallowing food or Liquids, No Abdominal pain, No Nausea or Vommitting, Bowel movements are regular, No Blood in stool or Urine, No dysuria, No new skin rashes or bruises, No new joints pains-aches,  No new weakness, tingling, numbness in any extremity, No recent weight gain or loss, No polyuria, polydypsia or polyphagia, No significant Mental Stressors.  A full 10 point Review of Systems was done, except as stated above, all other Review of Systems were negative.   With Past History of the following :    Past Medical History:  Diagnosis Date  . Crohn's disease ([place])   . Glaucoma   . Hyperlipidemia   . Hypertension   . Prediabetes   . Vitamin D deficiency       Past Surgical History:  Procedure Laterality Date  . TONSILLECTOMY AND ADENOIDECTOMY        Social  History:     Social History  Substance Use Topics  . Smoking status: Never Smoker  . Smokeless tobacco: Never Used  . Alcohol use No     Lives - at [facility] independent living  Mobility - ambulates by self     Family History :     Family History  Problem Relation Age of Onset  . Stroke Father       Home Medications:   Prior to Admission medications   Medication Sig Start Date End Date Taking? Authorizing Provider  amLODipine (NORVASC) 5 MG tablet Take 1 tablet (5 mg total) by mouth daily. 01/22/16 01/21/17 Yes Vicie Mutters, PA-C  aspirin 81 MG chewable tablet Chew by mouth daily.   Yes Historical Provider, MD  atenolol (TENORMIN) 50 MG tablet TAKE 1 TABLET 3 TIMES A DAY. 12/22/15  Yes Unk Pinto, MD  Cholecalciferol 4000 UNITS CAPS Take 2,000 Units by mouth daily.    Yes Historical Provider, MD  isosorbide mononitrate (IMDUR) 30 MG 24 hr tablet Take 1 tablet (30 mg total) by mouth daily. Patient taking differently: Take 30-60 mg by mouth daily as needed (BP- Hold if BP is less than 140/90).  01/22/16  Yes Vicie Mutters, PA-C  latanoprost (XALATAN) 0.005 % ophthalmic solution Place 1 drop into both eyes at bedtime.  12/06/14  Yes Historical Provider, MD  Magnesium 250 MG TABS Take 1 tablet by mouth 2 (two) times daily.   Yes Historical Provider, MD  Mesalamine (ASACOL HD) 800 MG TBEC Take 1 tablet (800 mg total) by mouth 2 (two) times daily. 01/01/16  Yes Vicie Mutters, PA-C  multivitamin-lutein (OCUVITE-LUTEIN) CAPS capsule Take 1 capsule by mouth 2 (two) times daily.   Yes Historical Provider, MD  traMADol (ULTRAM) 50 MG tablet TAKE 1 TABLET 3 TIMES DAILY AS NEEDED FOR PAIN. 04/28/16  Yes Unk Pinto, MD     Allergies:     Allergies  Allergen Reactions  . Ace Inhibitors Other (See Comments)    On MAR     Physical Exam:   Vitals  Blood pressure 127/63, pulse 68, resp. rate 21, SpO2 93 %.   1. General  lying in bed in NAD,    2. Normal affect and  insight, Not Suicidal or Homicidal, Awake Alert, Oriented X 3.  3. No F.N deficits, ALL C.Nerves Intact, Strength 5/5 all 4 extremities, Sensation intact all 4 extremities, Plantars down going.  4. Ears and Eyes appear Normal, Conjunctivae clear, PERRLA. Moist Oral Mucosa.  5. Supple Neck, No JVD, No cervical lymphadenopathy appriciated, No Carotid Bruits.  6. Symmetrical Chest wall movement, Good air movement bilaterally, CTAB.  7. RRR, No Gallops, Rubs or Murmurs, No Parasternal Heave.  8. Positive Bowel Sounds, Abdomen Soft, No tenderness, No organomegaly appriciated,No rebound -guarding or rigidity.  9.  No Cyanosis, Normal Skin Turgor, No Skin Rash or Bruise.  10. Good muscle tone,  joints appear normal , no effusions, Normal ROM.  11. No Palpable Lymph Nodes in Neck or Axillae     Data Review:    CBC  Recent Labs Lab 07/21/16 2228 07/22/16 0200  WBC 17.9* 28.8*  HGB 13.9 12.9  HCT 41.5 39.2  PLT 390 464*  MCV 84.7 84.8  MCH 28.4 27.9  MCHC 33.5 32.9  RDW 14.4 14.5  LYMPHSABS  --  0.9  MONOABS  --  1.7*  EOSABS  --  0.0  BASOSABS  --  0.0   ------------------------------------------------------------------------------------------------------------------  Chemistries   Recent Labs Lab 07/21/16 2228  NA 131*  K 4.3  CL 98*  CO2 21*  GLUCOSE 121*  BUN 24*  CREATININE 1.10*  CALCIUM 8.6*   ------------------------------------------------------------------------------------------------------------------ CrCl cannot be calculated (Unknown ideal weight.). ------------------------------------------------------------------------------------------------------------------ No results for input(s): TSH, T4TOTAL, T3FREE, THYROIDAB in the last 72 hours.  Invalid input(s): FREET3  Coagulation profile No results for input(s): INR, PROTIME in the last 168  hours. ------------------------------------------------------------------------------------------------------------------- No results for input(s): DDIMER in the last 72 hours. -------------------------------------------------------------------------------------------------------------------  Cardiac Enzymes  Recent Labs Lab 07/21/16 2236 07/22/16 0200  TROPONINI <0.03 <0.03   ------------------------------------------------------------------------------------------------------------------    Component Value Date/Time   BNP 564.0 (H) 07/22/2016 0012     ---------------------------------------------------------------------------------------------------------------  Urinalysis    Component Value Date/Time   COLORURINE YELLOW 07/22/2016 0034   APPEARANCEUR CLEAR 07/22/2016 0034   LABSPEC 1.011 07/22/2016 0034  PHURINE 7.0 07/22/2016 0034   GLUCOSEU NEGATIVE 07/22/2016 0034   HGBUR TRACE (A) 07/22/2016 0034   BILIRUBINUR NEGATIVE 07/22/2016 0034   KETONESUR NEGATIVE 07/22/2016 0034   PROTEINUR 30 (A) 07/22/2016 0034   UROBILINOGEN 0.2 07/24/2015 1116   NITRITE NEGATIVE 07/22/2016 0034   LEUKOCYTESUR NEGATIVE 07/22/2016 0034    ----------------------------------------------------------------------------------------------------------------   Imaging Results:    Dg Chest 2 View  Result Date: 07/21/2016 CLINICAL DATA:  Sudden onset of right upper chest pain and shortness of breath. EXAM: CHEST  2 VIEW COMPARISON:  Two-view chest x-ray 07/24/15 FINDINGS: The heart is enlarged. There is increase in a diffuse interstitial pattern. Airspace opacities are more prominent left than right. Remote right-sided rib fractures are noted. Multilevel degenerative changes are noted in the thoracic spine. IMPRESSION: 1. Interval increase in diffuse interstitial and airspace disease. This likely represents edema. Infection is also considered. 2. Stable cardiomegaly. Electronically Signed   By:  San Morelle M.D.   On: 07/21/2016 22:22      Assessment & Plan:    Active Problems:   Chest pain at rest   Leukocytosis    1. Chest pain Tele Trop I q6h x3 Check cardiac echo  2. Dyspnea ? CHF.  ? pneumonia Tele strict I and o Check daily weight Check tsh Rocephin , zithromax x1 dose in ED.  Lasix 20mg  iv bid ntp 1/2 inch topically q8h Cont atenolol Consider entresto if EF <=40% Check cardiac echo Please consult cardiology in am  3. Hyponatremia Check serum osm, tsh, cortisol,  Check urine sodium, urine osm  4. Leukocytosis Unclear source,  Repeat cbc in am If still elevated consider continuation of abx  DVT Prophylaxis  Lovenox - SCDs  AM Labs Ordered, also please review Full Orders  Family Communication: Admission, patients condition and plan of care including tests being ordered have been discussed with the patient  who indicate understanding and agree with the plan and Code Status.  Code Status DNR  Likely DC to    Condition GUARDED    Consults called:   Admission status: inpatient  Time spent in minutes : 45 minutes   Jani Gravel M.D on 07/22/2016 at 3:17 AM  Between 7am to 7pm - Pager - [phone]   After 7pm go to [email or web address] - password [facility]  [facility] - Office  [phone]

## 2016-07-22 NOTE — ED Notes (Signed)
PT can go to floor at 12:22.

## 2016-07-22 NOTE — ED Notes (Signed)
Unable to collect labs on patient ultrasound is in the room

## 2016-07-22 NOTE — Progress Notes (Signed)
Pharmacy Antibiotic Note  Courtney Fuller is a 82 y.o. female presented to the ED from [facility] on 07/21/2016 with c/o CP, SOB and cough.  Patient received ceftriaxone and azithromycin x1 in the ED for suspected PNA.  WBC increased sharply over night.  To start vancomycin and cefepime for broad empiric coverage for suspected HCAP.  - 8/16 CXR: Interval increase in diffuse interstitial and airspace disease.This likely represents edema. Infection is also considered. - wbc up 32, scr up 1.26 (crcl~26)   Plan: - vancomycin 1gm IV x1, then 750 mg IV q24h (goal trough 15-20) - cefepime 2gm IV x1, then 1gm IV q24h - f/u renal function  ________________________  Height: 5' (152.4 cm) Weight: 147 lb (66.7 kg) IBW/kg (Calculated) : 45.5  No data recorded.   Recent Labs Lab 07/21/16 2228 07/22/16 0200 07/22/16 0451  WBC 17.9* 28.8* 32.0*  CREATININE 1.10*  --  1.26*    Estimated Creatinine Clearance: 20.7 mL/min (by C-G formula based on SCr of 1.26 mg/dL).    Allergies  Allergen Reactions  . Ace Inhibitors Other (See Comments)    On MAR     Thank you for allowing pharmacy to be a part of this patient's care.  Lynelle Doctor 07/22/2016 12:22 PM

## 2016-07-22 NOTE — Progress Notes (Signed)
   Echocardiogram 2D Echocardiogram has been performed.  Diamond Nickel 07/22/2016, 9:36 AM

## 2016-07-22 NOTE — Progress Notes (Signed)
PROGRESS NOTE    Courtney Fuller  [id number] DOB: 11-02-1915 DOA: 07/21/2016  PCP: Alesia Richards, MD   Brief Narrative:  Patient is 82 year old female with known hypertension, Crohn's disease, presented to [facility] emergency department with sudden onset of left-sided chest pain, intermittent in nature, 5 out of 10 in severity, occasionally but not consistently radiating across the chest, associated with dry cough, no specific alleviating factors.   Active Problems:   Sepsis - Please note that patient met criteria for sepsis on admission with WBC 17K, respiratory rate up to 27, source likely bibasilar pneumonia - Unfortunately blood cultures not collected on admission and patient has gotten 1 dose of Rocephin and Cipro in emergency department - I have placed sepsis order set in place and says WBC continues trending up, 32K this morning and with patient with persistent cough it is reasonable to broaden the coverage to vancomycin and Maxipime for now - Once we receive results of sputum analysis, urine Legionella strep pneumo, we can narrow down antibiotics as clinically indicated - We'll also need to follow-up on sepsis order set which includes procalcitonin, lactic acid, blood cultures - We'll proceed with CT chest for clear evaluation   Chest pain  - Suspect this is related to underlying pneumonia versus vascular congestion - Troponins minimally elevated and not consistent with ACS pattern - Given patient's age and her preference for conservative management, there is no role for cardiologist input at this time - We will however keep patient on monitor for 24 hours specifically to monitor stability of heart rhythm and rate - I also do not think there is a role for further troponin cycling as her management would not change   Acute congestive heart failure ([place]) - With bibasilar crackles on exam, no lower extremity edema - We'll hold off on fluids for now and provide low-dose  Lasix - Echocardiogram on this admission notable for grade 2 diastolic CHF, normal EF   Chronic kidney disease stage 3-4 - 6 months ago creatinine was 1.36 with GFR in 30s - Creatinine on this admission appears to be at baseline - CBC in the morning     DVT prophylaxis: Lovenox SQ Code Status: Full Family Communication: Patient at bedside  Disposition Plan: Back to assisted living facility  Consultants:   None   Procedures:   None  Antimicrobials:   Zithromax and Rocephin on admission, changed to vancomycin Maxipime 07/22/2016   Subjective: Persistent cough and chest tightness specifically present with coughing spells.  Objective: Vitals:   07/22/16 0830 07/22/16 0900 07/22/16 0930 07/22/16 1142  BP: (!) 139/53 136/56 130/65 (!) 122/53  Pulse: 61 64 63 68  Resp: 25 24 (!) 27 15  SpO2: 96% 99% 99% 97%  Weight:      Height:       No intake or output data in the 24 hours ending 07/22/16 1214 Filed Weights   07/22/16 0453  Weight: 66.7 kg (147 lb)    Examination:  General exam: Appears calm and comfortable  Respiratory system: Dullness to percussion at bases with decreased breath sounds, no wheezing Cardiovascular system: No JVD, rubs, gallops or clicks. No pedal edema. Gastrointestinal system: Abdomen is nondistended, soft and nontender. No organomegaly or masses felt. Normal bowel sounds heard. Central nervous system: Alert and oriented. No focal neurological deficits.  Data Reviewed: I have personally reviewed following labs and imaging studies  CBC:  Recent Labs Lab 07/21/16 2228 07/22/16 0200 07/22/16 0451  WBC 17.9* 28.8* 32.0*  NEUTROABS  --  26.2*  --   HGB 13.9 12.9 13.1  HCT 41.5 39.2 38.6  MCV 84.7 84.8 83.4  PLT 390 464* 720*   Basic Metabolic Panel:  Recent Labs Lab 07/21/16 2228 07/22/16 0451  NA 131* 134*  K 4.3 4.4  CL 98* 98*  CO2 21* 26  GLUCOSE 121* 131*  BUN 24* 28*  CREATININE 1.10* 1.26*  CALCIUM 8.6* 8.7*   Liver  Function Tests:  Recent Labs Lab 07/22/16 0451  AST 18  ALT 11*  ALKPHOS 61  BILITOT 0.8  PROT 7.7  ALBUMIN 3.6   Cardiac Enzymes:  Recent Labs Lab 07/21/16 2236 07/22/16 0200 07/22/16 0451  TROPONINI <0.03 <0.03 0.04*   Thyroid Function Tests:  Recent Labs  07/22/16 0200  TSH 2.299   Urine analysis:    Component Value Date/Time   COLORURINE YELLOW 07/22/2016 0034   APPEARANCEUR CLEAR 07/22/2016 0034   LABSPEC 1.011 07/22/2016 0034   PHURINE 7.0 07/22/2016 0034   GLUCOSEU NEGATIVE 07/22/2016 0034   HGBUR TRACE (A) 07/22/2016 0034   BILIRUBINUR NEGATIVE 07/22/2016 0034   KETONESUR NEGATIVE 07/22/2016 0034   PROTEINUR 30 (A) 07/22/2016 0034   UROBILINOGEN 0.2 07/24/2015 1116   NITRITE NEGATIVE 07/22/2016 0034   LEUKOCYTESUR NEGATIVE 07/22/2016 0034    Radiology Studies: Dg Chest 2 View  Result Date: 07/21/2016 CLINICAL DATA:  Sudden onset of right upper chest pain and shortness of breath. EXAM: CHEST  2 VIEW COMPARISON:  Two-view chest x-ray 07/24/15 FINDINGS: The heart is enlarged. There is increase in a diffuse interstitial pattern. Airspace opacities are more prominent left than right. Remote right-sided rib fractures are noted. Multilevel degenerative changes are noted in the thoracic spine. IMPRESSION: 1. Interval increase in diffuse interstitial and airspace disease. This likely represents edema. Infection is also considered. 2. Stable cardiomegaly. Electronically Signed   By: San Morelle M.D.   On: 07/21/2016 22:22      Scheduled Meds: . amLODipine  5 mg Oral Daily  . aspirin  81 mg Oral Daily  . atenolol  50 mg Oral BID  . cholecalciferol  2,000 Units Oral Daily  . enoxaparin (LOVENOX) injection  30 mg Subcutaneous Q24H  . furosemide  20 mg Intravenous BID  . latanoprost  1 drop Both Eyes QHS  . magnesium oxide  400 mg Oral BID  . Mesalamine  800 mg Oral BID  . multivitamin  1 tablet Oral BID  . nitroGLYCERIN  0.5 inch Topical Q8H  .  sodium chloride flush  3 mL Intravenous Q12H  . sodium chloride flush  3 mL Intravenous Q12H   Continuous Infusions: . sodium chloride       LOS: 0 days    Time spent: 20 minutes    Faye Ramsay, MD [facility] Pager [phone]  If 7PM-7AM, please contact night-coverage [email or web address] Password [facility] 07/22/2016, 12:14 PM

## 2016-07-22 NOTE — ED Notes (Signed)
Called Courtney Fuller in lab and he added on the BNP.

## 2016-07-22 NOTE — Clinical Social Work Note (Signed)
Clinical Social Work Assessment  Patient Details  Name: Courtney Fuller MRN: [id number] Date of Birth: 18-Jun-1915  Date of referral:  07/22/16               Reason for consult:  Other (Comment Required) (Pt. from [place])                Permission sought to share information with:    Permission granted to share information::     Name::        Agency::     Relationship::     Contact Information:     Housing/Transportation Living arrangements for the past 2 months:  [place] of Information:  Patient Patient Interpreter Needed:  None Criminal Activity/Legal Involvement Pertinent to Current Situation/Hospitalization:  No - Comment as needed Significant Relationships:  None Lives with:  Facility Resident Do you feel safe going back to the place where you live?    Need for family participation in patient care:     Care giving concerns: Unknown   Facilities manager / plan: CSW spoke with patient at bedside who states she came in because of chest pain.  Patient reports she has been at [employer] for five years. Patient reports she has no living family, however, she states she as close friends and former neighbors. Patient reports she just began using a walker due to arthritis in her left knee. Patient reports she completes her own ADL's, feeding, bathing, and dressing. Patient reports she was employed until age 6. No questions noted for CSW at this time.   Employment status:  Retired Forensic scientist:   [employer]) PT Recommendations:    Information / Referral to community resources:     Patient/Family's Response to care: Unknown  Patient/Family's Understanding of and Emotional Response to Diagnosis, Current Treatment, and Prognosis: Unknown  Emotional Assessment Appearance:  Appears stated age Attitude/Demeanor/Rapport:    Affect (typically observed):  Accepting, Appropriate Orientation:  Oriented to Self, Oriented to Place, Oriented  to  Time, Oriented to Situation Alcohol / Substance use:  Not Applicable Psych involvement (Current and /or in the community):  No (Comment)  Discharge Needs  Concerns to be addressed:   (Unknown) Readmission within the last 30 days:    Current discharge risk:   (Unknown) Barriers to Discharge:   (Unknown)   Guadelupe Sabin, LCSW 07/22/2016, 3:18 PM

## 2016-07-23 DIAGNOSIS — I517 Cardiomegaly: Secondary | ICD-10-CM

## 2016-07-23 DIAGNOSIS — I10 Essential (primary) hypertension: Secondary | ICD-10-CM

## 2016-07-23 DIAGNOSIS — I350 Nonrheumatic aortic (valve) stenosis: Secondary | ICD-10-CM

## 2016-07-23 DIAGNOSIS — I5031 Acute diastolic (congestive) heart failure: Secondary | ICD-10-CM

## 2016-07-23 DIAGNOSIS — I2510 Atherosclerotic heart disease of native coronary artery without angina pectoris: Secondary | ICD-10-CM

## 2016-07-23 LAB — CBC
HCT: 38 % (ref 36.0–46.0)
HEMOGLOBIN: 12.4 g/dL (ref 12.0–15.0)
MCH: 27.4 pg (ref 26.0–34.0)
MCHC: 32.6 g/dL (ref 30.0–36.0)
MCV: 84.1 fL (ref 78.0–100.0)
Platelets: 439 10*3/uL — ABNORMAL HIGH (ref 150–400)
RBC: 4.52 MIL/uL (ref 3.87–5.11)
RDW: 14.5 % (ref 11.5–15.5)
WBC: 17.1 10*3/uL — AB (ref 4.0–10.5)

## 2016-07-23 LAB — BASIC METABOLIC PANEL
Anion gap: 10 (ref 5–15)
BUN: 31 mg/dL — ABNORMAL HIGH (ref 6–20)
CALCIUM: 8.3 mg/dL — AB (ref 8.9–10.3)
CHLORIDE: 97 mmol/L — AB (ref 101–111)
CO2: 25 mmol/L (ref 22–32)
Creatinine, Ser: 1.48 mg/dL — ABNORMAL HIGH (ref 0.44–1.00)
GFR calc Af Amer: 32 mL/min — ABNORMAL LOW (ref >60)
GFR, EST NON AFRICAN AMERICAN: 28 mL/min — AB (ref >60)
GLUCOSE: 107 mg/dL — AB (ref 65–99)
POTASSIUM: 3.9 mmol/L (ref 3.5–5.1)
Sodium: 132 mmol/L — ABNORMAL LOW (ref 135–145)

## 2016-07-23 NOTE — Progress Notes (Signed)
PROGRESS NOTE    Courtney Fuller  VVZ:[id number] DOB: 12-15-1914 DOA: 07/21/2016  PCP: Alesia Richards, MD   Brief Narrative:  Patient is 82 year old female with known hypertension, Crohn's disease, presented to [facility] emergency department with sudden onset of left-sided chest pain, intermittent in nature, 5 out of 10 in severity, occasionally but not consistently radiating across the chest, associated with dry cough, no specific alleviating factors.   Active Problems:   Sepsis - Please note that patient met criteria for sepsis on admission with WBC 17K, respiratory rate up to 27, source likely bibasilar pneumonia - Unfortunately blood cultures not collected on admission and patient has gotten 1 dose of Rocephin and Cipro in emergency department - I have placed sepsis order set in place 8/17 and changed ABX coverage to vancomycin and Maxipime - CT chest reviewed and findings suspicious for possible acute endobronchial infection/small airways disease in the setting of cough, moderate cardiomegaly without pulmonary edema, small to moderate pericardial effusion - will continue same ABX coverage for now as it seems that pt is overall improving, WBC trending down and pt afebrile in the past 24 hours  - Once we receive results of sputum analysis, urine Legionella strep pneumo, we can narrow down antibiotics as clinically indicated    Chest pain  - Suspect this is related to underlying pulmonary etiology outlined above  - Troponins minimally elevated and not consistent with ACS pattern - since pt has denied chest pain for the past 24 hours, we did not cycle troponins  - ECHO reviewed, EF 55% with grade II diastolic CHF - there was mild to moderate pericardial effusion and wandering if also related to underlying pulmonary etiology  - I have paged cardiology to discuss findings, will follow up on recommendations but would think that there is no role for invasive interventions and that  conservative management is reasonable as pt is clinically stable at this time    Acute congestive heart failure ([place]) - With bibasilar crackles on exam on admission but currently no lower extremity edema, weight down from 147 --> 145 lbs - continue home regimen with lasix  - follow up on cardiology recommendations     Chronic kidney disease stage 3-4 - 6 months ago creatinine was 1.36 with GFR in 30s - Creatinine on this admission appears to be at baseline - CBC in the morning    DVT prophylaxis: Lovenox SQ Code Status: Full Family Communication: Patient at bedside, [place] [phone] Disposition Plan: Back to assisted living facility  Consultants:   Cardiology   Procedures:   None  Antimicrobials:   Zithromax and Rocephin on admission, changed to vancomycin Maxipime 07/22/2016 -->   Subjective: Persistent cough and chest tightness specifically present with coughing spells.  Objective: Vitals:   07/22/16 1230 07/22/16 1349 07/22/16 2016 07/23/16 0607  BP: 121/67  (!) 127/56 125/64  Pulse: 65 65 61 61  Resp: 25 20 (!) 22 20  Temp:  98.5 F (36.9 C) 97.7 F (36.5 C) 98.2 F (36.8 C)  TempSrc:  Oral Oral Oral  SpO2: 98% (!) 88% 96% 96%  Weight:  65.5 kg (144 lb 6.4 oz)  65.8 kg (145 lb 1 oz)  Height:  5' (1.524 m)      Intake/Output Summary (Last 24 hours) at 07/23/16 0736 Last data filed at 07/23/16 0654  Gross per 24 hour  Intake              360 ml  Output  300 ml  Net               60 ml   Filed Weights   07/22/16 0453 07/22/16 1349 07/23/16 0607  Weight: 66.7 kg (147 lb) 65.5 kg (144 lb 6.4 oz) 65.8 kg (145 lb 1 oz)    Examination:  General exam: Appears calm and comfortable  Respiratory system: Dullness to percussion at bases with decreased breath sounds, no wheezing Cardiovascular system: No rubs, gallops or clicks. No pedal edema. Gastrointestinal system: Abdomen is nondistended, soft and nontender. No organomegaly or masses  felt.  Central nervous system: Alert and oriented. No focal neurological deficits.  Data Reviewed: I have personally reviewed following labs and imaging studies  CBC:  Recent Labs Lab 07/21/16 2228 07/22/16 0200 07/22/16 0451 07/23/16 0524  WBC 17.9* 28.8* 32.0* 17.1*  NEUTROABS  --  26.2*  --   --   HGB 13.9 12.9 13.1 12.4  HCT 41.5 39.2 38.6 38.0  MCV 84.7 84.8 83.4 84.1  PLT 390 464* 489* 101*   Basic Metabolic Panel:  Recent Labs Lab 07/21/16 2228 07/22/16 0451 07/23/16 0524  NA 131* 134* 132*  K 4.3 4.4 3.9  CL 98* 98* 97*  CO2 21* 26 25  GLUCOSE 121* 131* 107*  BUN 24* 28* 31*  CREATININE 1.10* 1.26* 1.48*  CALCIUM 8.6* 8.7* 8.3*   Liver Function Tests:  Recent Labs Lab 07/22/16 0451  AST 18  ALT 11*  ALKPHOS 61  BILITOT 0.8  PROT 7.7  ALBUMIN 3.6   Cardiac Enzymes:  Recent Labs Lab 07/21/16 2236 07/22/16 0200 07/22/16 0451 07/22/16 1140 07/22/16 1758  TROPONINI <0.03 <0.03 0.04* <0.03 <0.03   Thyroid Function Tests:  Recent Labs  07/22/16 0200  TSH 2.299   Urine analysis:    Component Value Date/Time   COLORURINE YELLOW 07/22/2016 0034   APPEARANCEUR CLEAR 07/22/2016 0034   LABSPEC 1.011 07/22/2016 0034   PHURINE 7.0 07/22/2016 0034   GLUCOSEU NEGATIVE 07/22/2016 0034   HGBUR TRACE (A) 07/22/2016 0034   BILIRUBINUR NEGATIVE 07/22/2016 0034   KETONESUR NEGATIVE 07/22/2016 0034   PROTEINUR 30 (A) 07/22/2016 0034   UROBILINOGEN 0.2 07/24/2015 1116   NITRITE NEGATIVE 07/22/2016 0034   LEUKOCYTESUR NEGATIVE 07/22/2016 0034    Radiology Studies: Dg Chest 2 View Result Date: 07/21/2016 CLINICAL DATA:  Interval increase in diffuse interstitial and airspace disease. This likely represents edema. Infection is also considered.   Ct Chest Wo Contrast Result Date: 07/22/2016 CLINICAL DATA:  Mild scattered nodular tree-in-bud opacities involving the bilateral upper lobes, right worse than left, suspicious for possible acute  endobronchial infection/small airways disease in the setting of cough. 2. Moderate cardiomegaly without pulmonary edema. 3. Small to moderate pericardial effusion. 4. Prominent aortic and mitral valvular calcifications, with prominent coronary and aortic atherosclerotic disease.    Scheduled Meds: . amLODipine  5 mg Oral Daily  . aspirin  81 mg Oral Daily  . atenolol  50 mg Oral BID  . ceFEPime (MAXIPIME) IV  1 g Intravenous Q24H  . cholecalciferol  2,000 Units Oral Daily  . enoxaparin (LOVENOX) injection  30 mg Subcutaneous Q24H  . furosemide  20 mg Intravenous BID  . latanoprost  1 drop Both Eyes QHS  . magnesium oxide  400 mg Oral BID  . Mesalamine  800 mg Oral BID  . multivitamin  1 tablet Oral BID  . nitroGLYCERIN  0.5 inch Topical Q8H  . sodium chloride flush  3 mL Intravenous Q12H  .  sodium chloride flush  3 mL Intravenous Q12H  . vancomycin  750 mg Intravenous Q24H   Continuous Infusions:     LOS: 1 day    Time spent: 20 minutes    Faye Ramsay, MD [facility] Pager [phone]  If 7PM-7AM, please contact night-coverage [email or web address] Password [facility] 07/23/2016, 7:36 AM

## 2016-07-23 NOTE — Consult Note (Signed)
Reason for Consult: CHF  Requesting Physician: Doyle Askew  Cardiologist: New  HPI: This is a 82 y.o. female with a past medical history significant for remarkable health and longevity. Her first emergency room evaluation ever occurred in August 2016, for hypertension. She has never been hospitalized until the current admission.  She has a long-standing history of systemic hypertension and recently has had problems with maintaining normal blood pressure. This was actually the reason for her ER visit in August 2016, when hypertension called dizziness. Over the last several days and weeks her blood pressure has again been difficult to control. In 2007 she reports seeing Dr. Sherren Mocha for an abnormal EKG, but apparently the abnormality was an artifact (per the patient's report). She bears a diagnosis of Crohn's disease which has not been a problem in a long time.  She is currently a resident in assisted living at [facility]. Her medical power of attorney is [place] (Kourtlynn was a lifelong friend of [name]'s mother-in-law and she has no living relatives of her own). She has a DNR status.  Her current hospitalization was triggered by pleuritic chest pain (sharp, "every time I breathe"), accompanied by a slight nonproductive cough and a sensation of chilliness, but no fever, rigors, angina, hemoptysis, leg pain or swelling. He reports at most a 2-3 pound weight gain recently. She did note that she would feel better sitting up rather than laying in bed.  Treatment with diuretics lead to rapid improvement in breathing. IV Antibiotics were initiated. She still requires oxygen supplementation, with hypoxia after ambulation. Blood pressure was high on arrival but is currently well within normal range  The patient's white blood cell count was severely elevated at greater than 30,000. It has improved rapidly. Her workup revealed evidence of acute endobronchial infection by CT chest, moderate  cardiomegaly without overt pulmonary edema, a small to moderate pericardial effusion by CT and extensive calcification in the heart valves and coronary arteries. Echocardiography showed that the pericardial effusion is not a significant abnormality, showed severe LVH with LVEF of 50-55% and normal regional wall motion. It documented degenerative mild aortic valve stenosis and severe mitral annular calcification without stenosis. The left atrium was severely dilated. BNP was markedly elevated at almost 600 (no baseline available), cardiac troponin was insignificantly elevated on one of 5 assays only. Electrocardiogram does not show acute ischemic abnormalities. The creatinine increased slightly with diuresis.    PMHx:  Past Medical History:  Diagnosis Date  . Crohn's disease ([place])   . Glaucoma   . Hyperlipidemia   . Hypertension   . Prediabetes   . Vitamin D deficiency    Past Surgical History:  Procedure Laterality Date  . TONSILLECTOMY AND ADENOIDECTOMY      FAMHx: Family History  Problem Relation Age of Onset  . Stroke Father     SOCHx:  reports that she has never smoked. She has never used smokeless tobacco. She reports that she does not drink alcohol. Her drug history is not on file.  ALLERGIES: Allergies  Allergen Reactions  . Ace Inhibitors Other (See Comments)    On MAR    ROS: Pertinent items noted in HPI and remainder of comprehensive ROS otherwise negative.  HOME MEDICATIONS: Prescriptions Prior to Admission  Medication Sig Dispense Refill Last Dose  . amLODipine (NORVASC) 5 MG tablet Take 1 tablet (5 mg total) by mouth daily. 90 tablet 3 07/21/2016 at Unknown time  . aspirin 81 MG chewable tablet Chew by mouth  daily.   07/21/2016 at 1000  . Cholecalciferol 4000 UNITS CAPS Take 2,000 Units by mouth daily.    07/21/2016 at Unknown time  . isosorbide mononitrate (IMDUR) 30 MG 24 hr tablet Take 1 tablet (30 mg total) by mouth daily. (Patient taking differently: Take  30-60 mg by mouth daily as needed (BP- Hold if BP is less than 140/90). ) 90 tablet 1 07/21/2016 at Unknown time  . latanoprost (XALATAN) 0.005 % ophthalmic solution Place 1 drop into both eyes at bedtime.    07/20/2016 at Unknown time  . Magnesium 250 MG TABS Take 1 tablet by mouth 2 (two) times daily.   07/21/2016 at Unknown time  . Mesalamine (ASACOL HD) 800 MG TBEC Take 1 tablet (800 mg total) by mouth 2 (two) times daily. 180 tablet 3 07/21/2016 at Unknown time  . multivitamin-lutein (OCUVITE-LUTEIN) CAPS capsule Take 1 capsule by mouth 2 (two) times daily.   07/21/2016 at Unknown time  . traMADol (ULTRAM) 50 MG tablet TAKE 1 TABLET 3 TIMES DAILY AS NEEDED FOR PAIN. 90 tablet 0 07/21/2016 at Unknown time    HOSPITAL MEDICATIONS: I have reviewed the patient's current medications. Scheduled: . amLODipine  5 mg Oral Daily  . aspirin  81 mg Oral Daily  . atenolol  50 mg Oral BID  . ceFEPime (MAXIPIME) IV  1 g Intravenous Q24H  . cholecalciferol  2,000 Units Oral Daily  . enoxaparin (LOVENOX) injection  30 mg Subcutaneous Q24H  . furosemide  20 mg Intravenous BID  . latanoprost  1 drop Both Eyes QHS  . magnesium oxide  400 mg Oral BID  . Mesalamine  800 mg Oral BID  . multivitamin  1 tablet Oral BID  . nitroGLYCERIN  0.5 inch Topical Q8H  . sodium chloride flush  3 mL Intravenous Q12H  . sodium chloride flush  3 mL Intravenous Q12H  . vancomycin  750 mg Intravenous Q24H   Continuous:   VITALS: Blood pressure 111/65, pulse 63, temperature 98 F (36.7 C), temperature source Oral, resp. rate 18, height 5' (1.524 m), weight 145 lb 1 oz (65.8 kg), SpO2 100 %.  PHYSICAL EXAM:  General: Alert, oriented x3, no distress Head: no evidence of trauma, PERRL, EOMI, no exophtalmos or lid lag, no myxedema, no xanthelasma; normal ears, nose and oropharynx Neck: normal jugular venous pulsations and no hepatojugular reflux; brisk carotid pulses without delay and no carotid bruits Chest: clear to  auscultation, no signs of consolidation by percussion or palpation, normal fremitus, symmetrical and full respiratory excursions Cardiovascular: normal position and quality of the apical impulse, regular rhythm, normal first heart sound and normal second heart sound, no rubs or gallops, 2/6 early peaking systolic aortic murmur, no diastolic murmurs Abdomen: no tenderness or distention, no masses by palpation, no abnormal pulsatility or arterial bruits, normal bowel sounds, no hepatosplenomegaly Extremities: no clubbing, cyanosis;  no edema; 2+ radial, ulnar and brachial pulses bilaterally; 2+ right femoral, posterior tibial and dorsalis pedis pulses; 2+ left femoral, posterior tibial and dorsalis pedis pulses; no subclavian or femoral bruits Neurological: grossly nonfocal   LABS  CBC  Recent Labs  07/22/16 0200 07/22/16 0451 07/23/16 0524  WBC 28.8* 32.0* 17.1*  NEUTROABS 26.2*  --   --   HGB 12.9 13.1 12.4  HCT 39.2 38.6 38.0  MCV 84.8 83.4 84.1  PLT 464* 489* 123456*   Basic Metabolic Panel  Recent Labs  07/22/16 0451 07/23/16 0524  NA 134* 132*  K 4.4 3.9  CL  98* 97*  CO2 26 25  GLUCOSE 131* 107*  BUN 28* 31*  CREATININE 1.26* 1.48*  CALCIUM 8.7* 8.3*   Liver Function Tests  Recent Labs  07/22/16 0451  AST 18  ALT 11*  ALKPHOS 61  BILITOT 0.8  PROT 7.7  ALBUMIN 3.6   No results for input(s): LIPASE, AMYLASE in the last 72 hours. Cardiac Enzymes  Recent Labs  07/22/16 0451 07/22/16 1140 07/22/16 1758  TROPONINI 0.04* <0.03 <0.03   BNP Invalid input(s): POCBNP D-Dimer No results for input(s): DDIMER in the last 72 hours. Hemoglobin A1C No results for input(s): HGBA1C in the last 72 hours. Fasting Lipid Panel No results for input(s): CHOL, HDL, LDLCALC, TRIG, CHOLHDL, LDLDIRECT in the last 72 hours. Thyroid Function Tests  Recent Labs  07/22/16 0200  TSH 2.299      IMAGING: Dg Chest 2 View  Result Date: 07/21/2016 CLINICAL DATA:  Sudden  onset of right upper chest pain and shortness of breath. EXAM: CHEST  2 VIEW COMPARISON:  Two-view chest x-ray 07/24/15 FINDINGS: The heart is enlarged. There is increase in a diffuse interstitial pattern. Airspace opacities are more prominent left than right. Remote right-sided rib fractures are noted. Multilevel degenerative changes are noted in the thoracic spine. IMPRESSION: 1. Interval increase in diffuse interstitial and airspace disease. This likely represents edema. Infection is also considered. 2. Stable cardiomegaly. Electronically Signed   By: Marin Roberts M.D.   On: 07/21/2016 22:22   Ct Chest Wo Contrast  Result Date: 07/22/2016 CLINICAL DATA:  Initial evaluation for acute sudden onset left-sided chest pain, intermittent in nature. Also with dry cough. EXAM: CT CHEST WITHOUT CONTRAST TECHNIQUE: Multidetector CT imaging of the chest was performed following the standard protocol without IV contrast. COMPARISON:  Prior radiograph from earlier the same day. FINDINGS: Thyroid gland within normal limits. Mildly prominent right paratracheal node measures at the upper limits of normal at 1 cm. Few additional scattered shotty subcentimeter mediastinal lymph nodes present. Node definite pathologically enlarged mediastinal, hilar, or axillary lymph nodes identified on this noncontrast examination. Scattered atheromatous plaque within the aortic arch in at the origin of the great vessels. The aorta itself within normal limits for size without evidence for aneurysm. Moderate cardiomegaly present. Prominent aortic and mitral valvular calcifications. Scattered coronary artery calcifications present as well. Small to moderate pericardial effusion. Pulmonary arteries not well evaluated on this noncontrast examination. Evaluation of lungs mildly limited by motion artifact. Mild atelectatic changes present dependently within both lower lobes. Patchy and linear opacities within the lingula also old largely  favored to reflect atelectasis. Superimposed scattered areas of bronchiectasis present within the lingula. There are scattered tree-in-bud nodular densities within the right upper lobe, suspicious for possible small airways disease/endobronchial infection. Similar but less severe densities present within the left upper lobe as well. Superimposed bronchiectasis within the right upper lobe. No frank consolidative airspace disease or air bronchograms to suggest pneumonia. No significant interlobular septal thickening to suggest pulmonary edema. No pleural effusion. No pneumothorax. No worrisome pulmonary nodule or mass. Visualized portions of the upper abdomen demonstrate no acute abnormality. Small hiatal hernia noted. Prominent atheromatous plaque within the visualized intra-abdominal aorta and its branch vessels. No acute osseous abnormality. Few scatter remotely healed right-sided rib fractures noted. Fractures No worrisome lytic or blastic osseous lesions. Accentuation the normal thoracic kyphosis with scattered multilevel degenerative spondylolysis. IMPRESSION: 1. Mild scattered nodular tree-in-bud opacities involving the bilateral upper lobes, right worse than left, suspicious for possible acute endobronchial infection/small  airways disease in the setting of cough. 2. Moderate cardiomegaly without pulmonary edema. 3. Small to moderate pericardial effusion. 4. Prominent aortic and mitral valvular calcifications, with prominent coronary and aortic atherosclerotic disease. Electronically Signed   By: Jeannine Boga M.D.   On: 07/22/2016 13:53    ECG: All tracings showed normal sinus rhythm, occasional PACs. The initial ECG shows subtle ST segment depression in leads V5-V6, resolved by the next tracing. The PR interval is borderline short    IMPRESSION:  I suspect that Mrs. Milana Obey has an episode of acute diastolic heart failure triggered by an acute lung infection, unmasking underlying diastolic  dysfunction related to severe left ventricular hypertrophy. A component of pericarditis might have been present, but does not appear to be clinically significant. It is likely that there is underlying coronary artery disease, but the suspicion for an acute coronary event is extremely low. The dominant abnormality is severe left ventricular hypertrophy that is related to long-standing hypertension, possibly also component of infiltrative cardiomyopathy (senile amyloidosis). This might explain the absence of high voltage on ECG as we expected and a slender woman with severe LVH. She has responded very promptly to treatment. At this point there are no findings of suggest significant hypovolemia. Her creatinine has started to increase. It is probably appropriate to switch her to oral diuretics At discharge she should receive a maintenance dose of loop diuretic, in addition to her usual antihypertensive medications. Her antihypertensive medications seem to be a very sensible combination of beta blocker and calcium channel blocker which maybe have been providing some antianginal benefit. The doses of antihypertensives may need to be adjusted, but the components of her regimen appeared very appropriate  RECOMMENDATION: 1. Switch to oral loop diuretics, low-dose. 2. Educate regarding sodium restriction and low sodium diet at home 3. Continue current antihypertensive medications 4. Complete the antibiotic course 5. Workup for possible underlying CAD does not appear appropriate in this 82 year old without angina pectoris and with preserved left ventricular systolic function 6. I will be delighted to see her in follow-up as an outpatient 7. The referred to palliative care is appropriate in view of her advanced age, but I suspect she will overcome this acute problem well. Seems remarkably resilient.  Cardiology consultation will be available upon request over the weekend, if deemed necessary by the primary team.  Thank you for allowing me to participate in this remarkable 82 year old's care.  Time Spent Directly with Patient: 60 minutes  Sanda Klein, MD, [facility] [phone] office [phone] pager   07/23/2016, 8:12 PM

## 2016-07-23 NOTE — Progress Notes (Addendum)
Palliative Medicine consult noted. Due to high referral volume, there may be a delay seeing this patient. Please call the Palliative Medicine Team office at [phone] if recommendations are needed in the interim.  Thank you for inviting [place] to see this patient.   Marjie Skiff Henslee, RN, BSN, [facility] 07/23/2016 10:23 AM Cell [phone] 8:00-4:00 Monday-Friday Office [phone]  OK to cancel palliative care consult at this time.   Faye Ramsay, MD  [facility] Pager [phone]  If 7PM-7AM, please contact night-coverage [email or web address] Password [email or web address]

## 2016-07-23 NOTE — Evaluation (Signed)
Physical Therapy Evaluation Patient Details Name: Courtney Fuller MRN: [id number] DOB: 12/20/1914 Today's Date: 07/23/2016   History of Present Illness   82 year old female with known hypertension, Crohn's disease, admitted for sepsis and chest pain suspected to be related to underlying pneumonia versus vascular congestion  Clinical Impression  Pt admitted with above diagnosis. Pt currently with functional limitations due to the deficits listed below (see PT Problem List).  Pt will benefit from skilled PT to increase their independence and safety with mobility to allow discharge to the venue listed below.  Pt ambulated within room and fatigues quickly.  SPO2 89% after ambulating on room air so reapplied 2L O2 [place].  Pt may benefit from ST-SNF or more assist available for safety upon d/c (pt from ILF).    Follow Up Recommendations SNF;Supervision/Assistance - 24 hour    Equipment Recommendations  None recommended by PT    Recommendations for Other Services       Precautions / Restrictions Precautions Precautions: Fall Precaution Comments: monitor sats      Mobility  Bed Mobility Overal bed mobility: Needs Assistance Bed Mobility: Supine to Sit     Supine to sit: Supervision        Transfers Overall transfer level: Needs assistance Equipment used: Rolling walker (2 wheeled) Transfers: Sit to/from Stand Sit to Stand: Min guard         General transfer comment: uses posterior lean against rising surface to self assist with standing  Ambulation/Gait Ambulation/Gait assistance: Min guard Ambulation Distance (Feet): 20 Feet Assistive device: Rolling walker (2 wheeled) Gait Pattern/deviations: Step-through pattern     General Gait Details: pt steady with RW, reports "feels good to be up", pt assisted with ambulating to/from bathroom, declined farther distance due to fatigue, SPO2 89% room air upon return to recliner so reapplied 2L O2 [address] (RN notified)  Stairs             Wheelchair Mobility    Modified Rankin (Stroke Patients Only)       Balance Overall balance assessment:  (pt denies any recent falls)                                           Pertinent Vitals/Pain Pain Assessment: No/denies pain    Home Living       Type of Home: Independent living facility         Home Equipment: Walker - 2 wheels      Prior Function Level of Independence: Independent with assistive device(s)         Comments: pt reports she does not requires any assist at facility, uses RW     Hand Dominance        Extremity/Trunk Assessment               Lower Extremity Assessment: Generalized weakness      Cervical / Trunk Assessment: Kyphotic  Communication   Communication: HOH  Cognition Arousal/Alertness: Awake/alert Behavior During Therapy: WFL for tasks assessed/performed Overall Cognitive Status: Within Functional Limits for tasks assessed                      General Comments      Exercises        Assessment/Plan    PT Assessment Patient needs continued PT services  PT Diagnosis Difficulty walking;Generalized weakness   PT Problem List Decreased  strength;Decreased activity tolerance;Decreased mobility;Cardiopulmonary status limiting activity  PT Treatment Interventions DME instruction;Gait training;Functional mobility training;Therapeutic activities;Therapeutic exercise;Patient/family education   PT Goals (Current goals can be found in the Care Plan section) Acute Rehab PT Goals PT Goal Formulation: With patient Time For Goal Achievement: 08/06/16 Potential to Achieve Goals: Good    Frequency Min 3X/week   Barriers to discharge        Co-evaluation               End of Session Equipment Utilized During Treatment: Oxygen Activity Tolerance: Patient limited by fatigue Patient left: in chair;with call bell/phone within reach;with chair alarm set;with family/visitor  present Nurse Communication: Mobility status         Time: [id number] PT Time Calculation (min) (ACUTE ONLY): 17 min   Charges:   PT Evaluation $PT Eval Low Complexity: 1 Procedure     PT G Codes:        Dezi Schaner,KATHrine E 07/23/2016, 11:00 AM Carmelia Bake, PT, DPT 07/23/2016 Pager: [phone]

## 2016-07-24 DIAGNOSIS — R079 Chest pain, unspecified: Secondary | ICD-10-CM

## 2016-07-24 DIAGNOSIS — D72829 Elevated white blood cell count, unspecified: Secondary | ICD-10-CM

## 2016-07-24 DIAGNOSIS — J189 Pneumonia, unspecified organism: Secondary | ICD-10-CM

## 2016-07-24 LAB — CBC
HCT: 36.7 % (ref 36.0–46.0)
HEMOGLOBIN: 12 g/dL (ref 12.0–15.0)
MCH: 27.4 pg (ref 26.0–34.0)
MCHC: 32.7 g/dL (ref 30.0–36.0)
MCV: 83.8 fL (ref 78.0–100.0)
Platelets: 477 10*3/uL — ABNORMAL HIGH (ref 150–400)
RBC: 4.38 MIL/uL (ref 3.87–5.11)
RDW: 14.4 % (ref 11.5–15.5)
WBC: 12.4 10*3/uL — ABNORMAL HIGH (ref 4.0–10.5)

## 2016-07-24 LAB — BASIC METABOLIC PANEL
Anion gap: 9 (ref 5–15)
BUN: 38 mg/dL — ABNORMAL HIGH (ref 6–20)
CALCIUM: 8.5 mg/dL — AB (ref 8.9–10.3)
CHLORIDE: 97 mmol/L — AB (ref 101–111)
CO2: 28 mmol/L (ref 22–32)
CREATININE: 1.22 mg/dL — AB (ref 0.44–1.00)
GFR calc non Af Amer: 35 mL/min — ABNORMAL LOW (ref >60)
GFR, EST AFRICAN AMERICAN: 41 mL/min — AB (ref >60)
Glucose, Bld: 118 mg/dL — ABNORMAL HIGH (ref 65–99)
Potassium: 3.5 mmol/L (ref 3.5–5.1)
SODIUM: 134 mmol/L — AB (ref 135–145)

## 2016-07-24 NOTE — Progress Notes (Signed)
PROGRESS NOTE    Courtney Fuller  [id number] DOB: 06/03/1915 DOA: 07/21/2016  PCP: Alesia Richards, MD   Brief Narrative:  Patient is 82 year old female with known hypertension, Crohn's disease, presented to [facility] emergency department with sudden onset of left-sided chest pain, intermittent in nature, 5 out of 10 in severity, occasionally but not consistently radiating across the chest, associated with dry cough, no specific alleviating factors.      Sepsis - Please note that patient met criteria for sepsis on admission with WBC 17K, respiratory rate up to 27, source likely bibasilar pneumonia - Unfortunately blood cultures not collected on admission and patient has gotten 1 dose of Rocephin and Cipro in emergency department - IV abx - CT chest reviewed and findings suspicious for possible acute endobronchial infection/small airways disease in the setting of cough, moderate cardiomegaly without pulmonary edema, small to moderate pericardial effusion - will continue same ABX coverage for now as it seems that pt is overall improving, WBC trending down and pt afebrile in the past 24 hours  - change to PO abx in AM?    Chest pain  - Suspect this is related to underlying pulmonary etiology outlined above  - Troponins minimally elevated and not consistent with ACS pattern - since pt has denied chest pain for the past 24 hours, we did not cycle troponins  - ECHO reviewed, EF 55% with grade II diastolic CHF - there was mild to moderate pericardial effusion and wandering if also related to underlying pulmonary etiology  - seen by cardiology    Acute congestive heart failure ([place]) - With bibasilar crackles on exam on admission but currently no lower extremity edema, weight down from 147 --> 145 lbs - continue home regimen with lasix     Chronic kidney disease stage 3-4 - 6 months ago creatinine was 1.36 with GFR in 30s - Creatinine on this admission appears to be at  baseline - CBC in the morning    DVT prophylaxis: Lovenox SQ Code Status: Full Family Communication: Patient at bedside, [place] [phone] Disposition Plan: seen by PT-- recs SNF  Consultants:   Cardiology   Procedures:   None    Subjective: Occasional cough Feels better overall   Objective: Vitals:   07/23/16 0607 07/23/16 1438 07/23/16 2109 07/24/16 0442  BP: 125/64 111/65 (!) 171/69 (!) 121/51  Pulse: 61 63 67 (!) 57  Resp: _0 Temp: 98.2 F (36.8 C) 98 F (36.7 C) 97.7 F (36.5 C) 97.8 F (36.6 C)  TempSrc: Oral Oral Oral Oral  SpO2: 96% 100% 99% 97%  Weight: 65.8 kg (145 lb 1 oz)   65.7 kg (144 lb 13.5 oz)  Height:        Intake/Output Summary (Last 24 hours) at 07/24/16 1319 Last data filed at 07/23/16 1927  Gross per 24 hour  Intake              480 ml  Output              500 ml  Net              -20 ml   Filed Weights   07/22/16 1349 07/23/16 0607 07/24/16 0442  Weight: 65.5 kg (144 lb 6.4 oz) 65.8 kg (145 lb 1 oz) 65.7 kg (144 lb 13.5 oz)    Examination:  General exam: Appears calm and comfortable  Respiratory system: no increased work of breathing, diminished, no wheezing Cardiovascular system:  No rubs, gallops or clicks. No pedal edema. Gastrointestinal system: Abdomen is nondistended, soft and nontender. No organomegaly or masses felt.  Central nervous system: Alert and oriented. No focal neurological deficits.  Data Reviewed: I have personally reviewed following labs and imaging studies  CBC:  Recent Labs Lab 07/21/16 2228 07/22/16 0200 07/22/16 0451 07/23/16 0524 07/24/16 0510  WBC 17.9* 28.8* 32.0* 17.1* 12.4*  NEUTROABS  --  26.2*  --   --   --   HGB 13.9 12.9 13.1 12.4 12.0  HCT 41.5 39.2 38.6 38.0 36.7  MCV 84.7 84.8 83.4 84.1 83.8  PLT 390 464* 489* 439* 478*   Basic Metabolic Panel:  Recent Labs Lab 07/21/16 2228 07/22/16 0451 07/23/16 0524 07/24/16 0510  NA 131* 134* 132* 134*  K 4.3 4.4  3.9 3.5  CL 98* 98* 97* 97*  CO2 21* _0 GLUCOSE 121* 131* 107* 118*  BUN 24* 28* 31* 38*  CREATININE 1.10* 1.26* 1.48* 1.22*  CALCIUM 8.6* 8.7* 8.3* 8.5*   Liver Function Tests:  Recent Labs Lab 07/22/16 0451  AST 18  ALT 11*  ALKPHOS 61  BILITOT 0.8  PROT 7.7  ALBUMIN 3.6   Cardiac Enzymes:  Recent Labs Lab 07/21/16 2236 07/22/16 0200 07/22/16 0451 07/22/16 1140 07/22/16 1758  TROPONINI <0.03 <0.03 0.04* <0.03 <0.03   Thyroid Function Tests:  Recent Labs  07/22/16 0200  TSH 2.299   Urine analysis:    Component Value Date/Time   COLORURINE YELLOW 07/22/2016 0034   APPEARANCEUR CLEAR 07/22/2016 0034   LABSPEC 1.011 07/22/2016 0034   PHURINE 7.0 07/22/2016 0034   GLUCOSEU NEGATIVE 07/22/2016 0034   HGBUR TRACE (A) 07/22/2016 0034   BILIRUBINUR NEGATIVE 07/22/2016 0034   KETONESUR NEGATIVE 07/22/2016 0034   PROTEINUR 30 (A) 07/22/2016 0034   UROBILINOGEN 0.2 07/24/2015 1116   NITRITE NEGATIVE 07/22/2016 0034   LEUKOCYTESUR NEGATIVE 07/22/2016 0034    Radiology Studies: Dg Chest 2 View Result Date: 07/21/2016 CLINICAL DATA:  Interval increase in diffuse interstitial and airspace disease. This likely represents edema. Infection is also considered.   Ct Chest Wo Contrast Result Date: 07/22/2016 CLINICAL DATA:  Mild scattered nodular tree-in-bud opacities involving the bilateral upper lobes, right worse than left, suspicious for possible acute endobronchial infection/small airways disease in the setting of cough. 2. Moderate cardiomegaly without pulmonary edema. 3. Small to moderate pericardial effusion. 4. Prominent aortic and mitral valvular calcifications, with prominent coronary and aortic atherosclerotic disease.    Scheduled Meds: . amLODipine  5 mg Oral Daily  . aspirin  81 mg Oral Daily  . atenolol  50 mg Oral BID  . ceFEPime (MAXIPIME) IV  1 g Intravenous Q24H  . cholecalciferol  2,000 Units Oral Daily  . enoxaparin (LOVENOX) injection  30  mg Subcutaneous Q24H  . furosemide  20 mg Intravenous BID  . latanoprost  1 drop Both Eyes QHS  . magnesium oxide  400 mg Oral BID  . Mesalamine  800 mg Oral BID  . multivitamin  1 tablet Oral BID  . nitroGLYCERIN  0.5 inch Topical Q8H  . sodium chloride flush  3 mL Intravenous Q12H  . sodium chloride flush  3 mL Intravenous Q12H  . vancomycin  750 mg Intravenous Q24H   Continuous Infusions:     LOS: 2 days    Time spent: 22 minutes    [place], DO [facility] Pager [phone]  If 7PM-7AM, please contact night-coverage [email or web address] Password [email or web address] 07/24/2016, 1:19 PM

## 2016-07-25 LAB — BASIC METABOLIC PANEL
ANION GAP: 10 (ref 5–15)
BUN: 31 mg/dL — ABNORMAL HIGH (ref 6–20)
CALCIUM: 8.8 mg/dL — AB (ref 8.9–10.3)
CHLORIDE: 95 mmol/L — AB (ref 101–111)
CO2: 30 mmol/L (ref 22–32)
Creatinine, Ser: 1.2 mg/dL — ABNORMAL HIGH (ref 0.44–1.00)
GFR calc non Af Amer: 36 mL/min — ABNORMAL LOW (ref >60)
GFR, EST AFRICAN AMERICAN: 42 mL/min — AB (ref >60)
Glucose, Bld: 107 mg/dL — ABNORMAL HIGH (ref 65–99)
Potassium: 3.4 mmol/L — ABNORMAL LOW (ref 3.5–5.1)
Sodium: 135 mmol/L (ref 135–145)

## 2016-07-25 LAB — CBC
HEMATOCRIT: 38.9 % (ref 36.0–46.0)
HEMOGLOBIN: 12.3 g/dL (ref 12.0–15.0)
MCH: 27.2 pg (ref 26.0–34.0)
MCHC: 31.6 g/dL (ref 30.0–36.0)
MCV: 85.9 fL (ref 78.0–100.0)
Platelets: 521 10*3/uL — ABNORMAL HIGH (ref 150–400)
RBC: 4.53 MIL/uL (ref 3.87–5.11)
RDW: 14.6 % (ref 11.5–15.5)
WBC: 10.8 10*3/uL — AB (ref 4.0–10.5)

## 2016-07-25 MED ORDER — POTASSIUM CHLORIDE CRYS ER 20 MEQ PO TBCR
40.0000 meq | EXTENDED_RELEASE_TABLET | Freq: Once | ORAL | Status: AC
  Administered 2016-07-25: 40 meq via ORAL
  Filled 2016-07-25: qty 2

## 2016-07-25 MED ORDER — POTASSIUM CHLORIDE CRYS ER 20 MEQ PO TBCR
20.0000 meq | EXTENDED_RELEASE_TABLET | Freq: Every day | ORAL | Status: DC
  Administered 2016-07-26: 20 meq via ORAL
  Filled 2016-07-25: qty 1

## 2016-07-25 MED ORDER — LEVOFLOXACIN 750 MG PO TABS
750.0000 mg | ORAL_TABLET | ORAL | Status: DC
  Administered 2016-07-25: 750 mg via ORAL
  Filled 2016-07-25: qty 1

## 2016-07-25 MED ORDER — FUROSEMIDE 40 MG PO TABS
40.0000 mg | ORAL_TABLET | Freq: Every day | ORAL | Status: DC
  Administered 2016-07-26: 40 mg via ORAL
  Filled 2016-07-25: qty 1

## 2016-07-25 NOTE — Progress Notes (Signed)
PROGRESS NOTE    Courtney Fuller  [id number] DOB: 06-07-1915 DOA: 07/21/2016  PCP: Courtney Richards, MD   Brief Narrative:  Patient is 82 year old female with known hypertension, Crohn's disease, presented to [facility] emergency department with sudden onset of left-sided chest pain, intermittent in nature, 5 out of 10 in severity, occasionally but not consistently radiating across the chest, associated with dry cough, no specific alleviating factors.      Sepsis - Please note that patient met criteria for sepsis on admission with WBC 17K, respiratory rate up to 27, source likely bibasilar pneumonia - Unfortunately blood cultures not collected on admission and patient has gotten 1 dose of Rocephin and Cipro in emergency department - IV abx - CT chest reviewed and findings suspicious for possible acute endobronchial infection/small airways disease in the setting of cough, moderate cardiomegaly without pulmonary edema, small to moderate pericardial effusion - WBC trending down and pt afebrile in the past 24 hours  - change to PO abx and monitor -to SNF in AM    Chest pain  - Suspect this is related to underlying pulmonary etiology outlined above  - Troponins minimally elevated and not consistent with ACS pattern - since pt has denied chest pain for the past 24 hours, we did not cycle troponins  - ECHO reviewed, EF 55% with grade II diastolic CHF - there was mild to moderate pericardial effusion and wandering if also related to underlying pulmonary etiology  - seen by cardiology    Acute congestive heart failure ([place]) - With bibasilar crackles on exam on admission but currently no lower extremity edema, weight down - PO lasix now    Chronic kidney disease stage 3-4 - 6 months ago creatinine was 1.36 with GFR in 30s     DVT prophylaxis: Lovenox SQ Code Status: Full Family Communication: Patient at bedside, [place] [phone] 8/19 Disposition Plan: SNF in  AM  Consultants:   Cardiology   Procedures:   None    Subjective: Not getting much sleep   Objective: Vitals:   07/24/16 1505 07/24/16 2045 07/25/16 0417 07/25/16 0914  BP: (!) 129/58 (!) 141/50 (!) 157/61 (!) 124/55  Pulse: 60 (!) 59 60 (!) 54  Resp: 16 18 16    Temp: 98.2 F (36.8 C) 98.2 F (36.8 C) 97.5 F (36.4 C)   TempSrc: Oral Oral Oral   SpO2: 98% 92% 94%   Weight:   65 kg (143 lb 4.8 oz)   Height:        Intake/Output Summary (Last 24 hours) at 07/25/16 1130 Last data filed at 07/25/16 0934  Gross per 24 hour  Intake             1140 ml  Output              500 ml  Net              640 ml   Filed Weights   07/23/16 0607 07/24/16 0442 07/25/16 0417  Weight: 65.8 kg (145 lb 1 oz) 65.7 kg (144 lb 13.5 oz) 65 kg (143 lb 4.8 oz)    Examination:  General exam: Appears calm and comfortable  Respiratory system: no increased work of breathing, diminished, no wheezing Cardiovascular system: No rubs, gallops or clicks. No pedal edema. Gastrointestinal system: Abdomen is nondistended, soft and nontender. No organomegaly or masses felt.  Central nervous system: Alert and oriented. No focal neurological deficits.  Data Reviewed: I have personally reviewed following labs  and imaging studies  CBC:  Recent Labs Lab 07/22/16 0200 07/22/16 0451 07/23/16 0524 07/24/16 0510 07/25/16 0635  WBC 28.8* 32.0* 17.1* 12.4* 10.8*  NEUTROABS 26.2*  --   --   --   --   HGB 12.9 13.1 12.4 12.0 12.3  HCT 39.2 38.6 38.0 36.7 38.9  MCV 84.8 83.4 84.1 83.8 85.9  PLT 464* 489* 439* 477* 875*   Basic Metabolic Panel:  Recent Labs Lab 07/21/16 2228 07/22/16 0451 07/23/16 0524 07/24/16 0510 07/25/16 0635  NA 131* 134* 132* 134* 135  K 4.3 4.4 3.9 3.5 3.4*  CL 98* 98* 97* 97* 95*  CO2 21* 26 25 28 30   GLUCOSE 121* 131* 107* 118* 107*  BUN 24* 28* 31* 38* 31*  CREATININE 1.10* 1.26* 1.48* 1.22* 1.20*  CALCIUM 8.6* 8.7* 8.3* 8.5* 8.8*   Liver Function  Tests:  Recent Labs Lab 07/22/16 0451  AST 18  ALT 11*  ALKPHOS 61  BILITOT 0.8  PROT 7.7  ALBUMIN 3.6   Cardiac Enzymes:  Recent Labs Lab 07/21/16 2236 07/22/16 0200 07/22/16 0451 07/22/16 1140 07/22/16 1758  TROPONINI <0.03 <0.03 0.04* <0.03 <0.03   Thyroid Function Tests: No results for input(s): TSH, T4TOTAL, FREET4, T3FREE, THYROIDAB in the last 72 hours. Urine analysis:    Component Value Date/Time   COLORURINE YELLOW 07/22/2016 0034   APPEARANCEUR CLEAR 07/22/2016 0034   LABSPEC 1.011 07/22/2016 0034   PHURINE 7.0 07/22/2016 0034   GLUCOSEU NEGATIVE 07/22/2016 0034   HGBUR TRACE (A) 07/22/2016 0034   BILIRUBINUR NEGATIVE 07/22/2016 0034   KETONESUR NEGATIVE 07/22/2016 0034   PROTEINUR 30 (A) 07/22/2016 0034   UROBILINOGEN 0.2 07/24/2015 1116   NITRITE NEGATIVE 07/22/2016 0034   LEUKOCYTESUR NEGATIVE 07/22/2016 0034    Radiology Studies: Dg Chest 2 View Result Date: 07/21/2016 CLINICAL DATA:  Interval increase in diffuse interstitial and airspace disease. This likely represents edema. Infection is also considered.   Ct Chest Wo Contrast Result Date: 07/22/2016 CLINICAL DATA:  Mild scattered nodular tree-in-bud opacities involving the bilateral upper lobes, right worse than left, suspicious for possible acute endobronchial infection/small airways disease in the setting of cough. 2. Moderate cardiomegaly without pulmonary edema. 3. Small to moderate pericardial effusion. 4. Prominent aortic and mitral valvular calcifications, with prominent coronary and aortic atherosclerotic disease.    Scheduled Meds: . amLODipine  5 mg Oral Daily  . aspirin  81 mg Oral Daily  . atenolol  50 mg Oral BID  . ceFEPime (MAXIPIME) IV  1 g Intravenous Q24H  . cholecalciferol  2,000 Units Oral Daily  . enoxaparin (LOVENOX) injection  30 mg Subcutaneous Q24H  . furosemide  20 mg Intravenous BID  . latanoprost  1 drop Both Eyes QHS  . magnesium oxide  400 mg Oral BID  .  Mesalamine  800 mg Oral BID  . multivitamin  1 tablet Oral BID  . nitroGLYCERIN  0.5 inch Topical Q8H  . sodium chloride flush  3 mL Intravenous Q12H  . sodium chloride flush  3 mL Intravenous Q12H  . vancomycin  750 mg Intravenous Q24H   Continuous Infusions:     LOS: 3 days    Time spent: 25 minutes    [place], DO [facility] Pager [phone]  If 7PM-7AM, please contact night-coverage [email or web address] Password [email or web address] 07/25/2016, 11:30 AM

## 2016-07-25 NOTE — NC FL2 (Signed)
[place] LEVEL OF CARE SCREENING TOOL     IDENTIFICATION  Patient Name: Courtney Fuller Birthdate: [birth year] Sex: female Admission Date (Current Location): 07/21/2016  [facility] and [place] Number:  Herbalist and Address:  [facility], [place] [address], [place], [place] [address]      Provider Number: [id number]  Attending Physician Name and Address:  Geradine Girt, DO  Relative Name and Phone Number:       Current Level of Care: Hospital Recommended Level of Care: [place] Prior Approval Number:    Date Approved/Denied:   PASRR Number: [id number]  Discharge Plan: SNF    Current Diagnoses: Patient Active Problem List   Diagnosis Date Noted  . Chest pain at rest 07/22/2016  . Leukocytosis 07/22/2016  . Acute congestive heart failure ([place]) 07/22/2016  . Chest pain 07/22/2016  . Osteoarthritis 01/01/2016  . Encounter for Medicare annual wellness exam 07/03/2015  . Macular degeneration 12/25/2014  . CKD (chronic kidney disease) stage 3, GFR 30-59 ml/min 12/25/2014  . Hyperlipidemia   . Hypertension   . Prediabetes   . Vitamin D deficiency   . Glaucoma   . Crohn's disease ([place])     Orientation RESPIRATION BLADDER Height & Weight        O2 (2L [place]) Continent Weight: 65 kg (143 lb 4.8 oz) Height:  5' (152.4 cm)  BEHAVIORAL SYMPTOMS/MOOD NEUROLOGICAL BOWEL NUTRITION STATUS      Continent Diet  AMBULATORY STATUS COMMUNICATION OF NEEDS Skin   Limited Assist Verbally Normal                       Personal Care Assistance Level of Assistance  Bathing, Dressing Bathing Assistance: Limited assistance   Dressing Assistance: Limited assistance     Functional Limitations Info  Sight          SPECIAL CARE FACTORS FREQUENCY  PT (By licensed PT), OT (By licensed OT)     PT Frequency: 5/wk OT Frequency: 5/wk            Contractures      Additional Factors Info  Code Status, Allergies  Code Status Info: DNR Allergies Info: Ace Inhibitors           Current Medications (07/25/2016):  This is the current hospital active medication list Current Facility-Administered Medications  Medication Dose Route Frequency Provider Last Rate Last Dose  . 0.9 %  sodium chloride infusion  250 mL Intravenous PRN Jani Gravel, MD      . acetaminophen (TYLENOL) tablet 650 mg  650 mg Oral Q6H PRN Jani Gravel, MD       Or  . acetaminophen (TYLENOL) suppository 650 mg  650 mg Rectal Q6H PRN Jani Gravel, MD      . amLODipine (NORVASC) tablet 5 mg  5 mg Oral Daily Jani Gravel, MD   5 mg at 07/25/16 0915  . aspirin chewable tablet 81 mg  81 mg Oral Daily Jani Gravel, MD   81 mg at 07/25/16 0915  . atenolol (TENORMIN) tablet 50 mg  50 mg Oral BID Jani Gravel, MD   50 mg at 07/25/16 1000  . ceFEPIme (MAXIPIME) 1 g in dextrose 5 % 50 mL IVPB  1 g Intravenous Q24H Anh P Pham, RPH   1 g at 07/24/16 1215  . cholecalciferol (VITAMIN D) tablet 2,000 Units  2,000 Units Oral Daily Jani Gravel, MD   2,000 Units at 07/25/16  [id number]  . enoxaparin (LOVENOX) injection 30 mg  30 mg Subcutaneous Q24H Jani Gravel, MD   30 mg at 07/25/16 0915  . furosemide (LASIX) injection 20 mg  20 mg Intravenous BID Jani Gravel, MD   20 mg at 07/25/16 0900  . latanoprost (XALATAN) 0.005 % ophthalmic solution 1 drop  1 drop Both Eyes QHS Jani Gravel, MD   1 drop at 07/24/16 2102  . magnesium oxide (MAG-OX) tablet 400 mg  400 mg Oral BID Jani Gravel, MD   400 mg at 07/25/16 0917  . Mesalamine (ASACOL) DR capsule 800 mg  800 mg Oral BID Jani Gravel, MD   800 mg at 07/25/16 0929  . morphine 2 MG/ML injection 1 mg  1 mg Intravenous Q4H PRN Jani Gravel, MD      . multivitamin (PROSIGHT) tablet 1 tablet  1 tablet Oral BID Jani Gravel, MD   1 tablet at 07/25/16 0929  . nitroGLYCERIN (NITROGLYN) 2 % ointment 0.5 inch  0.5 inch Topical Q8H Jani Gravel, MD   0.5 inch at 07/25/16 0558  . nitroGLYCERIN (NITROSTAT) SL tablet 0.4 mg  0.4 mg Sublingual Q5 min PRN Shanon Rosser, MD    0.4 mg at 07/22/16 0141  . sodium chloride flush (NS) 0.9 % injection 3 mL  3 mL Intravenous Q12H Jani Gravel, MD   3 mL at 07/25/16 1000  . sodium chloride flush (NS) 0.9 % injection 3 mL  3 mL Intravenous Q12H Jani Gravel, MD   3 mL at 07/24/16 2102  . sodium chloride flush (NS) 0.9 % injection 3 mL  3 mL Intravenous PRN Jani Gravel, MD      . traMADol Veatrice Bourbon) tablet 50 mg  50 mg Oral Q8H PRN Jani Gravel, MD   50 mg at 07/25/16 0418  . vancomycin (VANCOCIN) IVPB 750 mg/150 ml premix  750 mg Intravenous Q24H Anh P Pham, RPH   750 mg at 07/24/16 1215     Discharge Medications: Please see discharge summary for a list of discharge medications.  Relevant Imaging Results:  Relevant Lab Results:   Additional Information SS#: [id number]  Jorge Ny, LCSW

## 2016-07-25 NOTE — Plan of Care (Signed)
Problem: Safety: Goal: Ability to remain free from injury will improve Outcome: Completed/Met Date Met: 07/25/16 Discussed safety plan/prevention. Pt is aware to call for assistance when getting out of bed

## 2016-07-25 NOTE — Progress Notes (Signed)
Pharmacy Antibiotic Note  Courtney Fuller is a 82 y.o. female presented to the ED from NH on 07/21/2016 with c/o CP, SOB and cough.  Patient received ceftriaxone and azithromycin x1 in the ED for suspected PNA.  Pharmacy consulted to dose vancomycin and cefepime for broad empiric coverage for suspected HCAP.  Today, 07/25/2016 Day #4 Vancomycin / Cefepime Afebrile WBC improved SCr improved  Plan:  Per MD note yesterday, plan transition to oral abx today (consider augmentin 500mg  q12h for current renal function; recommend avoiding quinolones given age)  If not, will check vancomycin trough tomorrow  ________________________  Height: 5' (152.4 cm) Weight: 143 lb 4.8 oz (65 kg) IBW/kg (Calculated) : 45.5  Temp (24hrs), Avg:98 F (36.7 C), Min:97.5 F (36.4 C), Max:98.2 F (36.8 C)   Recent Labs Lab 07/21/16 2228 07/22/16 0200 07/22/16 0451 07/22/16 1422 07/22/16 1758 07/23/16 0524 07/24/16 0510 07/25/16 0635  WBC 17.9* 28.8* 32.0*  --   --  17.1* 12.4* 10.8*  CREATININE 1.10*  --  1.26*  --   --  1.48* 1.22* 1.20*  LATICACIDVEN  --   --   --  1.2 1.4  --   --   --     Estimated Creatinine Clearance: 21.5 mL/min (by C-G formula based on SCr of 1.2 mg/dL).    Allergies  Allergen Reactions  . Ace Inhibitors Other (See Comments)    On MAR   Antimicrobials this admission: 8/17 CTX/azithro x1 8/17 vanc>> 8/17 cefepime  Levels/dose changes this admission: ---  Microbiology results: 8/17 BCx: ngtd (sent after abx started) 8/17 MRSA PCR (-)   Thank you for allowing pharmacy to be a part of this patient's care.  Peggyann Juba, PharmD, BCPS Pager: [phone] 07/25/2016 12:37 PM

## 2016-07-26 LAB — BASIC METABOLIC PANEL
ANION GAP: 9 (ref 5–15)
BUN: 33 mg/dL — ABNORMAL HIGH (ref 6–20)
CO2: 32 mmol/L (ref 22–32)
Calcium: 9.3 mg/dL (ref 8.9–10.3)
Chloride: 95 mmol/L — ABNORMAL LOW (ref 101–111)
Creatinine, Ser: 1.26 mg/dL — ABNORMAL HIGH (ref 0.44–1.00)
GFR, EST AFRICAN AMERICAN: 39 mL/min — AB (ref >60)
GFR, EST NON AFRICAN AMERICAN: 34 mL/min — AB (ref >60)
Glucose, Bld: 123 mg/dL — ABNORMAL HIGH (ref 65–99)
POTASSIUM: 4.2 mmol/L (ref 3.5–5.1)
SODIUM: 136 mmol/L (ref 135–145)

## 2016-07-26 LAB — CBC
HCT: 40.6 % (ref 36.0–46.0)
Hemoglobin: 12.8 g/dL (ref 12.0–15.0)
MCH: 27.1 pg (ref 26.0–34.0)
MCHC: 31.5 g/dL (ref 30.0–36.0)
MCV: 85.8 fL (ref 78.0–100.0)
PLATELETS: 566 10*3/uL — AB (ref 150–400)
RBC: 4.73 MIL/uL (ref 3.87–5.11)
RDW: 14.3 % (ref 11.5–15.5)
WBC: 10.3 10*3/uL (ref 4.0–10.5)

## 2016-07-26 MED ORDER — POTASSIUM CHLORIDE CRYS ER 10 MEQ PO TBCR: 10.0000 meq | EXTENDED_RELEASE_TABLET | Freq: Every day | ORAL | Status: DC

## 2016-07-26 MED ORDER — ISOSORBIDE MONONITRATE ER 30 MG PO TB24: 30.0000 mg | ORAL_TABLET | Freq: Every day | ORAL | Status: DC | PRN

## 2016-07-26 MED ORDER — TRAMADOL HCL 50 MG PO TABS: ORAL_TABLET | ORAL | 0 refills | Status: DC

## 2016-07-26 MED ORDER — FUROSEMIDE 40 MG PO TABS: 20.0000 mg | ORAL_TABLET | Freq: Every day | ORAL | Status: DC

## 2016-07-26 MED ORDER — AMOXICILLIN-POT CLAVULANATE 500-125 MG PO TABS
1.0000 | ORAL_TABLET | Freq: Two times a day (BID) | ORAL | Status: DC
  Filled 2016-07-26: qty 1

## 2016-07-26 MED ORDER — AMOXICILLIN-POT CLAVULANATE 500-125 MG PO TABS
1.0000 | ORAL_TABLET | Freq: Two times a day (BID) | ORAL | Status: DC
Start: 2016-07-26 — End: 2016-07-27

## 2016-07-26 NOTE — Care Management Important Message (Signed)
Important Message  Patient Details  Name: Courtney Fuller MRN: [id number] Date of Birth: 1915-01-15   Medicare Important Message Given:  Yes    Camillo Flaming 07/26/2016, 10:25 [place] Message  Patient Details  Name: Courtney Fuller MRN: [id number] Date of Birth: 11-06-1915   Medicare Important Message Given:  Yes    Camillo Flaming 07/26/2016, 10:24 AM

## 2016-07-26 NOTE — Clinical Social Work Note (Signed)
Medical Social Worker facilitated patient discharge including contacting patient family and facility to confirm patient discharge plans.  Clinical information faxed to facility and family agreeable with plan. Patient to be transported by family/POA to [facility].  RN to call report prior to discharge.  Medical Social Worker will sign off for now as social work intervention is no longer needed. Please consult [place] again if new need arises.  [place]on Axe, MSW [phone] 07/26/2016 10:46 AM

## 2016-07-26 NOTE — Discharge Summary (Signed)
Physician Discharge Summary  RAMONICA GRIGG TZG:[id number] DOB: 1915/02/08 DOA: 07/21/2016  PCP: Alesia Richards, MD  Admit date: 07/21/2016 Discharge date: 07/26/2016   Recommendations for Outpatient Follow-Up:   1. BMP 1 week 2. ? If patient would benefit from palliative referral at SNF/ILF--- "I've outlived my friends, my family.  I've lived too long"-- defer to PCP 3. augmentin through 8/22 4. Daily weights   Discharge Diagnosis:   Active Problems:   Chest pain at rest   Leukocytosis   Acute congestive heart failure (HCC)   Chest pain   Discharge disposition:  SNF:  Discharge Condition: Improved.  Diet recommendation: Low sodium, heart healthy.   Wound care: None.   History of Present Illness:   Courtney Fuller  is a 82 y.o. female, w hx of hypertension, crohns disease apparently c/o chest pain, left side to right side, "sharp pain" intermittent, multiple times.  Sitting up seems to help it.  Slight dry cough.   Denies fever, chills, palp, n/v, diarrhea, brbpr, black stool.  Pt presented to ED for evaluation of chest pain.    In ED, took slg nitro with relief.  EKG showed nsr at 70, nl axis, nl int, no st-t changes c/w ischemia.  Pt will be admitted for w/up of chest pain   Hospital Course by Problem:   Sepsis - Please note that patient met criteria for sepsis on admission with WBC 17K, respiratory rate up to 27, source likely bibasilar pneumonia - Unfortunately blood cultures not collected on admission and patient has gotten 1 dose of Rocephin and Cipro in emergency department - IV abx changed to PO to complete course - CT chest reviewed and findings suspicious for possible acute endobronchial infection/small airways disease in the setting of cough, moderate cardiomegaly without pulmonary edema, small to moderate pericardial effusion - WBC trending down and pt afebrile     Chest pain  - Suspect this is related to underlying pulmonary etiology outlined  above  - Troponins minimally elevated and not consistent with ACS pattern - since pt has denied chest pain for the past 24 hours, we did not cycle troponins  - ECHO reviewed, EF 55% with grade II diastolic CHF - there was mild to moderate pericardial effusion and wandering if also related to underlying pulmonary etiology  - seen by cardiology- patient does not want to pursue work up at her age    Acute congestive heart failure ([place]) - With bibasilar crackles on exam on admission but currently no lower extremity edema, weight down - PO lasix now -BMP 1 week    Chronic kidney disease stage 3-4 - 6 months ago creatinine was 1.36 with GFR in 30s    Medical Consultants:   cardiology  Discharge Exam:   Vitals:   07/25/16 2100 07/26/16 0500  BP: (!) 145/58 (!) 159/60  Pulse: 60 61  Resp: 18 18  Temp: 98 F (36.7 C) 97.6 F (36.4 C)   Vitals:   07/25/16 0914 07/25/16 1447 07/25/16 2100 07/26/16 0500  BP: (!) 124/55 (!) 117/47 (!) 145/58 (!) 159/60  Pulse: (!) 54 (!) 59 60 61  Resp:  16 18 18   Temp:  97.7 F (36.5 C) 98 F (36.7 C) 97.6 F (36.4 C)  TempSrc:  Oral Oral Oral  SpO2:  96% 90% 90%  Weight:    65.1 kg (143 lb 8.3 oz)  Height:        Gen:  NAD-anxious to return to [employer] [facility]   The results  of significant diagnostics from this hospitalization (including imaging, microbiology, ancillary and laboratory) are listed below for reference.     Procedures and Diagnostic Studies:   Dg Chest 2 View  Result Date: 07/21/2016 CLINICAL DATA:  Sudden onset of right upper chest pain and shortness of breath. EXAM: CHEST  2 VIEW COMPARISON:  Two-view chest x-ray 07/24/15 FINDINGS: The heart is enlarged. There is increase in a diffuse interstitial pattern. Airspace opacities are more prominent left than right. Remote right-sided rib fractures are noted. Multilevel degenerative changes are noted in the thoracic spine. IMPRESSION: 1. Interval increase in diffuse interstitial and  airspace disease. This likely represents edema. Infection is also considered. 2. Stable cardiomegaly. Electronically Signed   By: San Morelle M.D.   On: 07/21/2016 22:22   Ct Chest Wo Contrast  Result Date: 07/22/2016 CLINICAL DATA:  Initial evaluation for acute sudden onset left-sided chest pain, intermittent in nature. Also with dry cough. EXAM: CT CHEST WITHOUT CONTRAST TECHNIQUE: Multidetector CT imaging of the chest was performed following the standard protocol without IV contrast. COMPARISON:  Prior radiograph from earlier the same day. FINDINGS: Thyroid gland within normal limits. Mildly prominent right paratracheal node measures at the upper limits of normal at 1 cm. Few additional scattered shotty subcentimeter mediastinal lymph nodes present. Node definite pathologically enlarged mediastinal, hilar, or axillary lymph nodes identified on this noncontrast examination. Scattered atheromatous plaque within the aortic arch in at the origin of the great vessels. The aorta itself within normal limits for size without evidence for aneurysm. Moderate cardiomegaly present. Prominent aortic and mitral valvular calcifications. Scattered coronary artery calcifications present as well. Small to moderate pericardial effusion. Pulmonary arteries not well evaluated on this noncontrast examination. Evaluation of lungs mildly limited by motion artifact. Mild atelectatic changes present dependently within both lower lobes. Patchy and linear opacities within the lingula also old largely favored to reflect atelectasis. Superimposed scattered areas of bronchiectasis present within the lingula. There are scattered tree-in-bud nodular densities within the right upper lobe, suspicious for possible small airways disease/endobronchial infection. Similar but less severe densities present within the left upper lobe as well. Superimposed bronchiectasis within the right upper lobe. No frank consolidative airspace disease or  air bronchograms to suggest pneumonia. No significant interlobular septal thickening to suggest pulmonary edema. No pleural effusion. No pneumothorax. No worrisome pulmonary nodule or mass. Visualized portions of the upper abdomen demonstrate no acute abnormality. Small hiatal hernia noted. Prominent atheromatous plaque within the visualized intra-abdominal aorta and its branch vessels. No acute osseous abnormality. Few scatter remotely healed right-sided rib fractures noted. Fractures No worrisome lytic or blastic osseous lesions. Accentuation the normal thoracic kyphosis with scattered multilevel degenerative spondylolysis. IMPRESSION: 1. Mild scattered nodular tree-in-bud opacities involving the bilateral upper lobes, right worse than left, suspicious for possible acute endobronchial infection/small airways disease in the setting of cough. 2. Moderate cardiomegaly without pulmonary edema. 3. Small to moderate pericardial effusion. 4. Prominent aortic and mitral valvular calcifications, with prominent coronary and aortic atherosclerotic disease. Electronically Signed   By: Jeannine Boga M.D.   On: 07/22/2016 13:53     Labs:   Basic Metabolic Panel:  Recent Labs Lab 07/22/16 0451 07/23/16 0524 07/24/16 0510 07/25/16 0635 07/26/16 0517  NA 134* 132* 134* 135 136  K 4.4 3.9 3.5 3.4* 4.2  CL 98* 97* 97* 95* 95*  CO2 26 25 28 30  32  GLUCOSE 131* 107* 118* 107* 123*  BUN 28* 31* 38* 31* 33*  CREATININE 1.26* 1.48* 1.22* 1.20*  1.26*  CALCIUM 8.7* 8.3* 8.5* 8.8* 9.3   GFR Estimated Creatinine Clearance: 20.5 mL/min (by C-G formula based on SCr of 1.26 mg/dL). Liver Function Tests:  Recent Labs Lab 07/22/16 0451  AST 18  ALT 11*  ALKPHOS 61  BILITOT 0.8  PROT 7.7  ALBUMIN 3.6   No results for input(s): LIPASE, AMYLASE in the last 168 hours. No results for input(s): AMMONIA in the last 168 hours. Coagulation profile  Recent Labs Lab 07/22/16 1403  INR 1.49     CBC:  Recent Labs Lab 07/22/16 0200 07/22/16 0451 07/23/16 0524 07/24/16 0510 07/25/16 0635 07/26/16 0517  WBC 28.8* 32.0* 17.1* 12.4* 10.8* 10.3  NEUTROABS 26.2*  --   --   --   --   --   HGB 12.9 13.1 12.4 12.0 12.3 12.8  HCT 39.2 38.6 38.0 36.7 38.9 40.6  MCV 84.8 83.4 84.1 83.8 85.9 85.8  PLT 464* 489* 439* 477* 521* 566*   Cardiac Enzymes:  Recent Labs Lab 07/21/16 2236 07/22/16 0200 07/22/16 0451 07/22/16 1140 07/22/16 1758  TROPONINI <0.03 <0.03 0.04* <0.03 <0.03   BNP: Invalid input(s): POCBNP CBG: No results for input(s): GLUCAP in the last 168 hours. D-Dimer No results for input(s): DDIMER in the last 72 hours. Hgb A1c No results for input(s): HGBA1C in the last 72 hours. Lipid Profile No results for input(s): CHOL, HDL, LDLCALC, TRIG, CHOLHDL, LDLDIRECT in the last 72 hours. Thyroid function studies No results for input(s): TSH, T4TOTAL, T3FREE, THYROIDAB in the last 72 hours.  Invalid input(s): FREET3 Anemia work up No results for input(s): VITAMINB12, FOLATE, FERRITIN, TIBC, IRON, RETICCTPCT in the last 72 hours. Microbiology Recent Results (from the past 240 hour(s))  Culture, blood (x 2)     Status: None (Preliminary result)   Collection Time: 07/22/16  2:22 PM  Result Value Ref Range Status   Specimen Description BLOOD LEFT ARM  Final   Special Requests IN PEDIATRIC BOTTLE 2 CC  Final   Culture   Final    NO GROWTH 3 DAYS Performed at [facility]    Report Status PENDING  Incomplete  Culture, blood (x 2)     Status: None (Preliminary result)   Collection Time: 07/22/16  2:22 PM  Result Value Ref Range Status   Specimen Description BLOOD RIGHT ARM  Final   Special Requests BOTTLES DRAWN AEROBIC AND ANAEROBIC 10 CC EA  Final   Culture   Final    NO GROWTH 3 DAYS Performed at [facility]    Report Status PENDING  Incomplete  MRSA PCR Screening     Status: None   Collection Time: 07/22/16  2:54 PM  Result Value Ref  Range Status   MRSA by PCR NEGATIVE NEGATIVE Final    Comment:        The GeneXpert MRSA Assay (FDA approved for NASAL specimens only), is one component of a comprehensive MRSA colonization surveillance program. It is not intended to diagnose MRSA infection nor to guide or monitor treatment for MRSA infections.      Discharge Instructions:   Discharge Instructions    Diet - low sodium heart healthy    Complete by:  As directed   Discharge instructions    Complete by:  As directed   augmentin through 8/23 BMP 1 week   Increase activity slowly    Complete by:  As directed       Medication List    TAKE these medications  amLODipine 5 MG tablet Commonly known as:  NORVASC Take 1 tablet (5 mg total) by mouth daily.   amoxicillin-clavulanate 500-125 MG tablet Commonly known as:  AUGMENTIN Take 1 tablet (500 mg total) by mouth 2 (two) times daily.   aspirin 81 MG chewable tablet Chew by mouth daily.   Cholecalciferol 4000 units Caps Take 2,000 Units by mouth daily.   furosemide 40 MG tablet Commonly known as:  LASIX Take 0.5 tablets (20 mg total) by mouth daily.   isosorbide mononitrate 30 MG 24 hr tablet Commonly known as:  IMDUR Take 1-2 tablets (30-60 mg total) by mouth daily as needed (BP- Hold if BP is less than 140/90).   latanoprost 0.005 % ophthalmic solution Commonly known as:  XALATAN Place 1 drop into both eyes at bedtime.   Magnesium 250 MG Tabs Take 1 tablet by mouth 2 (two) times daily.   Mesalamine 800 MG Tbec Commonly known as:  ASACOL HD Take 1 tablet (800 mg total) by mouth 2 (two) times daily.   multivitamin-lutein Caps capsule Take 1 capsule by mouth 2 (two) times daily.   potassium chloride 10 MEQ tablet Commonly known as:  K-DUR,KLOR-CON Take 1 tablet (10 mEq total) by mouth daily.   traMADol 50 MG tablet Commonly known as:  ULTRAM TAKE 1 TABLET 3 TIMES DAILY AS NEEDED FOR PAIN. What changed:  See the new instructions.       Follow-up Information    MCKEOWN,WILLIAM DAVID, MD Follow up in 1 week(s).   Specialty:  Internal Medicine Why:  BMP Contact information: [address] [place] [address] [phone]        Sanda Klein, MD. Schedule an appointment as soon as possible for a visit today.   Specialty:  Cardiology Contact information: [address] [place] [address] [phone]            Time coordinating discharge: 35 min  Signed:  Maikel Neisler Alison Stalling   [facility] 07/26/2016, 10:18 AM

## 2016-07-26 NOTE — Clinical Social Work Placement (Addendum)
   CLINICAL SOCIAL WORK PLACEMENT  NOTE  Date:  07/26/2016  Patient Details  Name: Courtney Fuller MRN: [id number] Date of Birth: [birth year]  Clinical Social Work is seeking post-discharge placement for this patient at the [place] level of care (*CSW will initial, date and re-position this form in  chart as items are completed):  Yes   Patient/family provided with [place] Work Department's list of facilities offering this level of care within the geographic area requested by the patient (or if unable, by the patient's family).  Yes   Patient/family informed of their freedom to choose among providers that offer the needed level of care, that participate in Medicare, Medicaid or managed care program needed by the patient, have an available bed and are willing to accept the patient.  Yes   Patient/family informed of [facility]'s ownership interest in [facility] and [facility], as well as of the fact that they are under no obligation to receive care at these facilities.  PASRR submitted to EDS on 07/25/16     PASRR number received on 07/25/16     Existing PASRR number confirmed on       FL2 transmitted to all facilities in geographic area requested by pt/family on 07/25/16     FL2 transmitted to all facilities within larger geographic area on       Patient informed that his/her managed care company has contracts with or will negotiate with certain facilities, including the following:        Yes   Patient/family informed of bed offers received.  Patient chooses bed at  [facility] )     Physician recommends and patient chooses bed at      Patient to be transferred to  ([facility] ) on 07/26/16.  Patient to be transferred to facility by  (via family car)     Patient family notified on 07/26/16 of transfer.  Name of family member notified:  [name], [place]     PHYSICIAN Please sign FL2, Please sign DNR, Please prepare priority  discharge summary, including medications     Additional Comment:    _______________________________________________ Glendon Axe A 07/26/2016, 10:45 AM

## 2016-07-26 NOTE — Progress Notes (Signed)
Physical Therapy Treatment Patient Details Name: SANDE KEATS MRN: [id number] DOB: 1915-06-08 Today's Date: August 21, 2016    History of Present Illness  82 year old female with known hypertension, Crohn's disease, admitted for sepsis and chest pain suspected to be related to underlying pneumonia versus vascular congestion    PT Comments    Pt reports feeling better and ready to d/c today.   Pt ambulated 160 feet in hallway with RW.  Follow Up Recommendations  Home health PT;Supervision for mobility/OOB     Equipment Recommendations       Recommendations for Other Services       Precautions / Restrictions Precautions Precautions: Fall    Mobility  Bed Mobility                  Transfers Overall transfer level: Needs assistance Equipment used: Rolling walker (2 wheeled) Transfers: Sit to/from Stand Sit to Stand: Supervision         General transfer comment: up in room with NT (used bathroom) on arrival  Ambulation/Gait Ambulation/Gait assistance: Min guard Ambulation Distance (Feet): 160 Feet Assistive device: Rolling walker (2 wheeled) Gait Pattern/deviations: Step-through pattern;Decreased stride length     General Gait Details: pt reports feeling better and ready for d/c home, tolerated ambulating 160 feet with RW well   Stairs            Wheelchair Mobility    Modified Rankin (Stroke Patients Only)       Balance                                    Cognition Arousal/Alertness: Awake/alert Behavior During Therapy: WFL for tasks assessed/performed Overall Cognitive Status: Within Functional Limits for tasks assessed                      Exercises      General Comments        Pertinent Vitals/Pain Pain Assessment: No/denies pain    Home Living                      Prior Function            PT Goals (current goals can now be found in the care plan section) Progress towards PT goals:  Progressing toward goals    Frequency  Min 3X/week    PT Plan Current plan remains appropriate    Co-evaluation             End of Session   Activity Tolerance: Patient tolerated treatment well Patient left: in chair;with call bell/phone within reach;with chair alarm set     Time: 1110-1118 PT Time Calculation (min) (ACUTE ONLY): 8 min  Charges:  $Gait Training: 8-22 mins                    G Codes:      Jaydn Moscato,KATHrine E August 21, 2016, 12:55 PM Carmelia Bake, PT, DPT 2016/08/21 Pager: [phone]

## 2016-07-27 ENCOUNTER — Non-Acute Institutional Stay (SKILLED_NURSING_FACILITY): Payer: Commercial Managed Care - HMO | Admitting: Internal Medicine

## 2016-07-27 DIAGNOSIS — M159 Polyosteoarthritis, unspecified: Secondary | ICD-10-CM | POA: Diagnosis not present

## 2016-07-27 DIAGNOSIS — N183 Chronic kidney disease, stage 3 unspecified: Secondary | ICD-10-CM

## 2016-07-27 DIAGNOSIS — H409 Unspecified glaucoma: Secondary | ICD-10-CM | POA: Diagnosis not present

## 2016-07-27 DIAGNOSIS — H3530 Unspecified macular degeneration: Secondary | ICD-10-CM | POA: Diagnosis not present

## 2016-07-27 DIAGNOSIS — I5031 Acute diastolic (congestive) heart failure: Secondary | ICD-10-CM | POA: Diagnosis not present

## 2016-07-27 DIAGNOSIS — J189 Pneumonia, unspecified organism: Secondary | ICD-10-CM

## 2016-07-27 DIAGNOSIS — I10 Essential (primary) hypertension: Secondary | ICD-10-CM | POA: Diagnosis not present

## 2016-07-27 DIAGNOSIS — K5010 Crohn's disease of large intestine without complications: Secondary | ICD-10-CM | POA: Diagnosis not present

## 2016-07-27 LAB — CULTURE, BLOOD (ROUTINE X 2)
Culture: NO GROWTH
Culture: NO GROWTH

## 2016-07-27 NOTE — Progress Notes (Signed)
Patient ID: Courtney Fuller, female   DOB: 1915-07-26, 82 y.o.   MRN: [id number]  Provider:  Rexene Edison. Mariea Clonts, D.O., C.M.D. Location:   [facility] Room Number: 146 rehab Place of Service:  SNF (31)  PCP: Alesia Richards, MD [name], MD as PCP - General (Internal Medicine)  Extended Emergency Contact Information Primary Emergency Contact: Alain Marion of [place] Phone: [phone] Mobile Phone: [phone] Relation: Friend Secondary Emergency Contact: Lyon,Nancy Address: [address]          [place] [address] Johnnette Litter of [place] Phone: [phone] Relation: Other  Code Status: DNR Goals of Care: Advanced Directive information Advanced Directives 07/27/2016  Does patient have an advance directive? Yes  Type of Advance Directive Out of facility DNR (pink MOST or yellow form);Living will;Healthcare Power of Attorney  Does patient want to make changes to advanced directive? -  Copy of advanced directive(s) in chart? Yes  Pre-existing out of facility DNR order (yellow form or pink MOST form) Yellow form placed in chart (order not valid for inpatient use)   Chief Complaint  Patient presents with  . New Admit To SNF    rehab admit    HPI: Patient is a 82 y.o. female seen today for admission to [facility] s/p hospitalization with increased shortness of breath, chills, malaise and poor po intake for several days--she was diagnosed with HCAP and CHF.  She reports she did not actually have CHF after all, only pneumonia.  Her weight was not really up over time upon review and she has not had edema.  She had bibasilar pneumonia and is completing her augmentin therapy today.  We discussed her goals and she does NOT want to go back to the hospital.  She agrees to be treated here at [facility] if she should become ill.  She already has DNR code status.  MOST form was completed for comfort measures, abx/fluids  TBD, and no tube feeding.  Her POA was present visiting.    Past Medical History:  Diagnosis Date  . Crohn's disease ([place])   . Glaucoma   . Hyperlipidemia   . Hypertension   . Prediabetes   . Vitamin D deficiency    Past Surgical History:  Procedure Laterality Date  . TONSILLECTOMY AND ADENOIDECTOMY      reports that she has never smoked. She has never used smokeless tobacco. She reports that she does not drink alcohol. Her drug history is not on file. Social History   Social History  . Marital status: Widowed    Spouse name: N/A  . Number of children: N/A  . Years of education: N/A   Occupational History  . Not on file.   Social History Main Topics  . Smoking status: Never Smoker  . Smokeless tobacco: Never Used  . Alcohol use No  . Drug use: Unknown  . Sexual activity: Not on file   Other Topics Concern  . Not on file   Social History Narrative  . No narrative on file     Family History  Problem Relation Age of Onset  . Stroke Father     Health Maintenance  Topic Date Due  . INFLUENZA VACCINE  07/06/2016  . DEXA SCAN  09/30/2017 (Originally 04/17/1982)  . TETANUS/TDAP  12/13/2022  . ZOSTAVAX  Completed  . PNA vac Low Risk Adult  Completed    Allergies  Allergen Reactions  . Ace Inhibitors Other (See Comments)  On MAR      Medication List       Accurate as of 07/27/16 12:23 PM. Always use your most recent med list.          amLODipine 5 MG tablet Commonly known as:  NORVASC Take 1 tablet (5 mg total) by mouth daily.   aspirin 81 MG chewable tablet Chew by mouth daily.   Cholecalciferol 4000 units Caps Take 2,000 Units by mouth daily.   furosemide 40 MG tablet Commonly known as:  LASIX Take 0.5 tablets (20 mg total) by mouth daily.   isosorbide mononitrate 30 MG 24 hr tablet Commonly known as:  IMDUR Take 30 mg by mouth daily. Hold if BP less than 140/90   latanoprost 0.005 % ophthalmic solution Commonly known as:   XALATAN Place 1 drop into both eyes at bedtime.   Magnesium 250 MG Tabs Take 1 tablet by mouth 2 (two) times daily.   Mesalamine 800 MG Tbec Commonly known as:  ASACOL HD Take 1 tablet (800 mg total) by mouth 2 (two) times daily.   multivitamin-lutein Caps capsule Take 1 capsule by mouth 2 (two) times daily.   potassium chloride 10 MEQ tablet Commonly known as:  K-DUR,KLOR-CON Take 1 tablet (10 mEq total) by mouth daily.   traMADol 50 MG tablet Commonly known as:  ULTRAM TAKE 1 TABLET 3 TIMES DAILY AS NEEDED FOR PAIN.       Review of Systems  Constitutional: Positive for fatigue. Negative for activity change, appetite change, chills and fever.  HENT: Positive for hearing loss. Negative for congestion, sinus pressure and sore throat.   Eyes: Positive for visual disturbance.       Vision very poor  Respiratory: Negative for chest tightness, shortness of breath and wheezing.   Cardiovascular: Negative for chest pain, palpitations and leg swelling.  Gastrointestinal: Negative for abdominal distention, abdominal pain, blood in stool, constipation, nausea and vomiting.  Genitourinary: Negative for dysuria.  Musculoskeletal: Positive for arthralgias.  Skin: Positive for color change.  Neurological: Positive for weakness. Negative for dizziness.  Psychiatric/Behavioral: Positive for confusion.       Short term memory loss (mild)    Vitals:   07/27/16 1215  BP: (!) 159/87  Pulse: 64  Resp: 17  Temp: (!) 96.2 F (35.7 C)  TempSrc: Oral  SpO2: 95%  Weight: 141 lb (64 kg)   Body mass index is 27.54 kg/m. Physical Exam  Constitutional: She appears well-developed and well-nourished. No distress.  HENT:  Head: Normocephalic and atraumatic.  Right Ear: External ear normal.  Left Ear: External ear normal.  Mouth/Throat: Oropharynx is clear and moist. No oropharyngeal exudate.  Eyes: Conjunctivae and EOM are normal. Pupils are equal, round, and reactive to light.  Neck:  Normal range of motion. Neck supple. No JVD present. No thyromegaly present.  Cardiovascular: Normal rate, regular rhythm, normal heart sounds and intact distal pulses.   Pulmonary/Chest: Effort normal and breath sounds normal. No respiratory distress. She has no wheezes. She has no rales.  Abdominal: Soft. Bowel sounds are normal. She exhibits no distension. There is no tenderness.  Musculoskeletal: Normal range of motion.  Uses walker  Lymphadenopathy:    She has no cervical adenopathy.  Neurological: She is alert.  Oriented to person, place and time; short term memory loss--does not recall having chf at [facility] cardiology said she did not, but notes indicate otherwise  Skin: Skin is warm and dry.  Psychiatric: She has a normal mood and affect.  Labs reviewed: Basic Metabolic Panel:  Recent Labs  01/01/16 1500  07/24/16 0510 07/25/16 0635 07/26/16 0517  NA 139  < > 134* 135 136  K 4.8  < > 3.5 3.4* 4.2  CL 101  < > 97* 95* 95*  CO2 28  < > 28 30 32  GLUCOSE 93  < > 118* 107* 123*  BUN 31*  < > 38* 31* 33*  CREATININE 1.36*  < > 1.22* 1.20* 1.26*  CALCIUM 9.0  < > 8.5* 8.8* 9.3  MG 2.2  --   --   --   --   < > = values in this interval not displayed. Liver Function Tests:  Recent Labs  01/01/16 1500 07/22/16 0451  AST 15 18  ALT 9 11*  ALKPHOS 59 61  BILITOT 0.5 0.8  PROT 6.9 7.7  ALBUMIN 3.7 3.6   No results for input(s): LIPASE, AMYLASE in the last 8760 hours. No results for input(s): AMMONIA in the last 8760 hours. CBC:  Recent Labs  01/01/16 1500  07/22/16 0200  07/24/16 0510 07/25/16 0635 07/26/16 0517  WBC 8.8  < > 28.8*  < > 12.4* 10.8* 10.3  NEUTROABS 6.2  --  26.2*  --   --   --   --   HGB 13.4  < > 12.9  < > 12.0 12.3 12.8  HCT 41.2  < > 39.2  < > 36.7 38.9 40.6  MCV 83.7  < > 84.8  < > 83.8 85.9 85.8  PLT 324  < > 464*  < > 477* 521* 566*  < > = values in this interval not displayed. Cardiac Enzymes:  Recent Labs  07/22/16 0451  07/22/16 1140 07/22/16 1758  TROPONINI 0.04* <0.03 <0.03   BNP: Invalid input(s): POCBNP Lab Results  Component Value Date   HGBA1C 5.5 12/19/2013   Lab Results  Component Value Date   TSH 2.299 07/22/2016   No results found for: VITAMINB12 No results found for: FOLATE No results found for: IRON, TIBC, FERRITIN  Imaging and Procedures obtained prior to SNF admission: Dg Chest 2 View  Result Date: 07/21/2016 CLINICAL DATA:  Sudden onset of right upper chest pain and shortness of breath. EXAM: CHEST  2 VIEW COMPARISON:  Two-view chest x-ray 07/24/15 FINDINGS: The heart is enlarged. There is increase in a diffuse interstitial pattern. Airspace opacities are more prominent left than right. Remote right-sided rib fractures are noted. Multilevel degenerative changes are noted in the thoracic spine. IMPRESSION: 1. Interval increase in diffuse interstitial and airspace disease. This likely represents edema. Infection is also considered. 2. Stable cardiomegaly. Electronically Signed   By: San Morelle M.D.   On: 07/21/2016 22:22   Ct Chest Wo Contrast  Result Date: 07/22/2016 CLINICAL DATA:  Initial evaluation for acute sudden onset left-sided chest pain, intermittent in nature. Also with dry cough. EXAM: CT CHEST WITHOUT CONTRAST TECHNIQUE: Multidetector CT imaging of the chest was performed following the standard protocol without IV contrast. COMPARISON:  Prior radiograph from earlier the same day. FINDINGS: Thyroid gland within normal limits. Mildly prominent right paratracheal node measures at the upper limits of normal at 1 cm. Few additional scattered shotty subcentimeter mediastinal lymph nodes present. Node definite pathologically enlarged mediastinal, hilar, or axillary lymph nodes identified on this noncontrast examination. Scattered atheromatous plaque within the aortic arch in at the origin of the great vessels. The aorta itself within normal limits for size without evidence for  aneurysm. Moderate cardiomegaly present. Prominent  aortic and mitral valvular calcifications. Scattered coronary artery calcifications present as well. Small to moderate pericardial effusion. Pulmonary arteries not well evaluated on this noncontrast examination. Evaluation of lungs mildly limited by motion artifact. Mild atelectatic changes present dependently within both lower lobes. Patchy and linear opacities within the lingula also old largely favored to reflect atelectasis. Superimposed scattered areas of bronchiectasis present within the lingula. There are scattered tree-in-bud nodular densities within the right upper lobe, suspicious for possible small airways disease/endobronchial infection. Similar but less severe densities present within the left upper lobe as well. Superimposed bronchiectasis within the right upper lobe. No frank consolidative airspace disease or air bronchograms to suggest pneumonia. No significant interlobular septal thickening to suggest pulmonary edema. No pleural effusion. No pneumothorax. No worrisome pulmonary nodule or mass. Visualized portions of the upper abdomen demonstrate no acute abnormality. Small hiatal hernia noted. Prominent atheromatous plaque within the visualized intra-abdominal aorta and its branch vessels. No acute osseous abnormality. Few scatter remotely healed right-sided rib fractures noted. Fractures No worrisome lytic or blastic osseous lesions. Accentuation the normal thoracic kyphosis with scattered multilevel degenerative spondylolysis. IMPRESSION: 1. Mild scattered nodular tree-in-bud opacities involving the bilateral upper lobes, right worse than left, suspicious for possible acute endobronchial infection/small airways disease in the setting of cough. 2. Moderate cardiomegaly without pulmonary edema. 3. Small to moderate pericardial effusion. 4. Prominent aortic and mitral valvular calcifications, with prominent coronary and aortic atherosclerotic  disease. Electronically Signed   By: Jeannine Boga M.D.   On: 07/22/2016 13:53    Assessment/Plan 1. HCAP (healthcare-associated pneumonia) -met sepsis criteria but blood cxs not done -seems she is better from this with improved appetite -no shortness of breath, not requiring oxygen, was able to walk around the unit and even into the skilled unit using her walker with her friend accompanying her -wbc was trending down (she's on steroids for her Crohn's) -completes augmentin today -she's been afebrile with normal sats and RR  2. Acute diastolic heart failure (HCC) -daily wts, low sodium heart healthy diet (which she does not plan to follow at home--goals are palliative and wants qol)  -wt was 141.8 today down from 143.52 yesterday -she is on po lasix -she's had no further chest pains and seems they were related to her pneumonia  3. CKD (chronic kidney disease) stage 3, GFR 30-59 ml/min -has stabilized, maintain adequate hydration (had poor intake prehospitalization and GFR had dropped into stage 4 while there) but better now -avoid nephrotoxic meds like nsaids  4. Crohn's disease of large intestine without complication ([place]) -continues on asacol with benefit (may be increasing wbc a little)  5. Osteoarthritis of multiple joints, unspecified osteoarthritis type -stable, she'd been using tramadol a little bit for the chest pain during hospitalization, but has not been needing here (had 15 tabs)  6. Essential hypertension -bp fluctuates considerably -sometimes not getting her isosorbide 7m daily due to hold parameters of <140/90  7. Glaucoma -continues xalatan drops from home -sees Dr. GKaty Fitch 8. Macular degeneration -severe and she is nearly blind--has difficulty seeing faces/making eye contact -sees Dr. RZadie Rhine Family/ staff Communication: discussed with her friend/POA, rehab nurse  Labs/tests ordered:  CBC, bmp in am

## 2016-07-28 ENCOUNTER — Ambulatory Visit (INDEPENDENT_AMBULATORY_CARE_PROVIDER_SITE_OTHER): Payer: Commercial Managed Care - HMO | Admitting: Cardiovascular Disease

## 2016-07-28 VITALS — BP 156/74 | HR 76 | Ht 60.0 in | Wt 141.0 lb

## 2016-07-28 DIAGNOSIS — I350 Nonrheumatic aortic (valve) stenosis: Secondary | ICD-10-CM

## 2016-07-28 DIAGNOSIS — I2510 Atherosclerotic heart disease of native coronary artery without angina pectoris: Secondary | ICD-10-CM | POA: Diagnosis not present

## 2016-07-28 DIAGNOSIS — K5010 Crohn's disease of large intestine without complications: Secondary | ICD-10-CM

## 2016-07-28 DIAGNOSIS — J189 Pneumonia, unspecified organism: Secondary | ICD-10-CM | POA: Diagnosis not present

## 2016-07-28 DIAGNOSIS — I5031 Acute diastolic (congestive) heart failure: Secondary | ICD-10-CM

## 2016-07-28 DIAGNOSIS — E785 Hyperlipidemia, unspecified: Secondary | ICD-10-CM

## 2016-07-28 DIAGNOSIS — I509 Heart failure, unspecified: Secondary | ICD-10-CM | POA: Diagnosis not present

## 2016-07-28 DIAGNOSIS — I10 Essential (primary) hypertension: Secondary | ICD-10-CM

## 2016-07-28 LAB — BASIC METABOLIC PANEL
BUN: 37 mg/dL — AB (ref 4–21)
Creatinine: 1.3 mg/dL — AB (ref 0.5–1.1)
GLUCOSE: 115 mg/dL
Potassium: 4.6 mmol/L (ref 3.4–5.3)
Sodium: 137 mmol/L (ref 137–147)

## 2016-07-28 LAB — CBC AND DIFFERENTIAL
HCT: 46 % (ref 36–46)
HEMOGLOBIN: 13.5 g/dL (ref 12.0–16.0)
PLATELETS: 585 10*3/uL — AB (ref 150–399)
WBC: 11.4 10*3/mL

## 2016-07-28 NOTE — Progress Notes (Signed)
Cardiology Office Note    Date:  07/30/2016   ID:  Courtney Fuller, DOB 1915-04-19, MRN [id number]  PCP:  Courtney Richards, MD  Cardiologist:   Courtney Klein, MD   Chief Complaint  Patient presents with  . New Evaluation    History of Present Illness:  Courtney Fuller is a 82 y.o. female with hypertension and a recent hospitalization for acute exacerbation of diastolic heart failure secondary to bronchopneumonia. She recovered quickly with diuretic and antibiotic therapy. Her chest pain was clearly pleuritic in nature. CT suggested the presence of a pericardial effusion, although this was not confirmed by echocardiography. It is possible that she had a component of parapneumonic pericarditis. Chest pain complaints have completely resolved and her breathing is back to baseline.  She is accompanied by Courtney Fuller who has medical power of attorney. Courtney Fuller does not have any surviving relatives and Courtney Fuller is the daughter-in-law of Courtney Fuller (who also recently passed). [name] remains fully alert and oriented and is capable of making decisions for herself. She has affirmed a DO NOT RESUSCITATE status. She is a resident in assisted living at [facility], but is currently in there are no seen unit as she receives rehabilitation following her recent hospitalization  The pressure control has recently been a little erratic, but today she has excellent blood pressure. She was taking isosorbide "as needed" for elevated blood pressure, but we will discontinue this today. She is now taking a low daily dose of loop diuretic. She does not have leg edema. She has been able to walk up and down the core doors with her walker with minimal assistance and is returning fairly quickly back to her baseline functional status.  Recent hospitalization: - CT chest: moderate cardiomegaly without overt pulmonary edema, a small to moderate pericardial effusion and extensive calcification in the heart  valves and coronary arteries.  - Echocardiography: pericardial effusion is not a significant abnormality, severe LVH with LVEF of 50-55% and normal regional wall motion, degenerative mild aortic valve stenosis and severe mitral annular calcification without stenosis. The left atrium was severely dilated.  - BNP was markedly elevated at almost 600 (no baseline available), cardiac troponin was insignificantly elevated on one of 5 assays only.  - Electrocardiogram does not show acute ischemic abnormalities.   Past Medical History:  Diagnosis Date  . Crohn's disease ([place])   . Glaucoma   . Hyperlipidemia   . Hypertension   . Prediabetes   . Vitamin D deficiency     Past Surgical History:  Procedure Laterality Date  . TONSILLECTOMY AND ADENOIDECTOMY      Current Medications: Outpatient Medications Prior to Visit  Medication Sig Dispense Refill  . amLODipine (NORVASC) 5 MG tablet Take 1 tablet (5 mg total) by mouth daily. 90 tablet 3  . aspirin 81 MG chewable tablet Chew by mouth daily.    . Cholecalciferol 4000 UNITS CAPS Take 2,000 Units by mouth daily.     . furosemide (LASIX) 40 MG tablet Take 0.5 tablets (20 mg total) by mouth daily. 30 tablet   . latanoprost (XALATAN) 0.005 % ophthalmic solution Place 1 drop into both eyes at bedtime.     . Magnesium 250 MG TABS Take 1 tablet by mouth 2 (two) times daily.    . Mesalamine (ASACOL HD) 800 MG TBEC Take 1 tablet (800 mg total) by mouth 2 (two) times daily. 180 tablet 3  . multivitamin-lutein (OCUVITE-LUTEIN) CAPS capsule Take 1 capsule by mouth 2 (two)  times daily.    . potassium chloride SA (K-DUR,KLOR-CON) 10 MEQ tablet Take 1 tablet (10 mEq total) by mouth daily.    . traMADol (ULTRAM) 50 MG tablet TAKE 1 TABLET 3 TIMES DAILY AS NEEDED FOR PAIN. 15 tablet 0  . isosorbide mononitrate (IMDUR) 30 MG 24 hr tablet Take 30 mg by mouth daily. Hold if BP less than 140/90     No facility-administered medications prior to visit.       Allergies:   Ace inhibitors   Social History   Social History  . Marital status: Widowed    Spouse name: N/A  . Number of children: N/A  . Years of education: N/A   Social History Main Topics  . Smoking status: Never Smoker  . Smokeless tobacco: Never Used  . Alcohol use No  . Drug use: Unknown  . Sexual activity: Not on file   Other Topics Concern  . Not on file   Social History Narrative  . No narrative on file     Family History:  The patient's family history includes Stroke in her father.   ROS:   Please see the history of present illness.    ROS All other systems reviewed and are negative.   PHYSICAL EXAM:   VS:  BP (!) 156/74 (BP Location: Right Arm, Patient Position: Sitting, Cuff Size: Normal)   Pulse 76   Ht 5' (1.524 m)   Wt 141 lb (64 kg)   SpO2 97%   BMI 27.54 kg/m    GEN: Well nourished, well developed, in no acute distress  HEENT: normal  Neck: no JVD, carotid bruits, or masses Cardiac: RRR; no murmurs, rubs, or gallops,no edema  Respiratory:  clear to auscultation bilaterally, normal work of breathing GI: soft, nontender, nondistended, + BS MS: no deformity or atrophy  Skin: warm and dry, no rash Neuro:  Alert and Oriented x 3, Strength and sensation are intact Psych: euthymic mood, full affect  Wt Readings from Last 3 Encounters:  07/28/16 141 lb (64 kg)  07/27/16 141 lb (64 kg)  07/26/16 143 lb 8.3 oz (65.1 kg)      Studies/Labs Reviewed:   EKG:  EKG is not ordered today.  Recent Labs: 01/01/2016: Magnesium 2.2 07/22/2016: ALT 11; B Natriuretic Peptide 564.0; TSH 2.299 07/26/2016: BUN 33; Creatinine, Ser 1.26; Hemoglobin 12.8; Platelets 566; Potassium 4.2; Sodium 136   Lipid Panel    Component Value Date/Time   CHOL 203 (H) 12/25/2014 1513   TRIG 170 (H) 12/25/2014 1513   HDL 42 12/25/2014 1513   CHOLHDL 4.8 12/25/2014 1513   VLDL 34 12/25/2014 1513   LDLCALC 127 (H) 12/25/2014 1513     ASSESSMENT:    1. Acute diastolic  heart failure ([place])   2. Essential hypertension   3. Coronary artery calcification seen on CT scan   4. Mild aortic stenosis   5. Hyperlipidemia   6. Crohn's disease of large intestine without complication ([place])      PLAN:  In order of problems listed above:  1. CHF: Appears to have resolved, seems to be euvolemic today. Likely has underlying hypertensive heart disease, maybe also cardiac amyloidosis (supported by relatively low QRS voltage in a patient with severe LVH) and probably multivessel CAD. Plan conservative treatment with diuretics and blood pressure control. 2. HTN: Blood pressure little high. I think it is best to discontinue the isosorbide, since it is unlikely to be a very effective agent or her blood pressure. On the  other hand it seems that her atenolol was inadvertently discontinued at the time of discharge. I don't see any documentation of a problem with bradycardia or hypotension that would've made her stop it. Since her blood pressure is only mildly elevated and I'm not sure why the medicine was stopped, we'll restart the atenolol at a lower dose of 25 mg daily, titrate up as necessary. 3. Coronary disease: Suspected, but without evidence of coronary insufficiency clinically or by ECG. Recommend very conservative management in this elderly patient. Continue aspirin. 4. AS: Does not appear to be hemodynamically significant 5. HLP: Mild. No symptoms of angina or other vascular disease. Although there is evidence of atherosclerosis by imaging studies, I find it doubtful that treatment with a statin will make a big difference in this near-centenarian. 6. Well-controlled with Asacol.    Medication Adjustments/Labs and Tests Ordered: Current medicines are reviewed at length with the patient today.  Concerns regarding medicines are outlined above.  Medication changes, Labs and Tests ordered today are listed in the Patient Instructions below. There are no Patient Instructions on  file for this visit.   Signed, Courtney Klein, MD  07/30/2016 10:28 AM    [facility] [address], [place], [place]  [address] Phone: [phone]; Fax: [phone]

## 2016-07-28 NOTE — Patient Instructions (Signed)
Dr Sallyanne Kuster has recommended making the following medication changes: 1. STOP Isosorbide  Dr C recommends that you schedule a follow-up appointment in 3 months.  If you need a refill on your cardiac medications before your next appointment, please call your pharmacy.

## 2016-07-30 ENCOUNTER — Telehealth: Payer: Self-pay

## 2016-07-30 ENCOUNTER — Encounter: Payer: Self-pay | Admitting: Cardiovascular Disease

## 2016-07-30 DIAGNOSIS — I350 Nonrheumatic aortic (valve) stenosis: Secondary | ICD-10-CM | POA: Insufficient documentation

## 2016-07-30 DIAGNOSIS — I2510 Atherosclerotic heart disease of native coronary artery without angina pectoris: Secondary | ICD-10-CM | POA: Insufficient documentation

## 2016-07-30 NOTE — Telephone Encounter (Signed)
Faxed consultation report to [place] at [facility] for their charting purposes.  Dr C recommended restarting Atenolol 25 mg daily upon further chart review and requested daily blood pressure to be faxed back in ~1 week. Consult report updated prior to faxing.

## 2016-08-02 ENCOUNTER — Encounter: Payer: Self-pay | Admitting: Adult Health

## 2016-08-02 ENCOUNTER — Non-Acute Institutional Stay (SKILLED_NURSING_FACILITY): Payer: Commercial Managed Care - HMO | Admitting: Adult Health

## 2016-08-02 DIAGNOSIS — H409 Unspecified glaucoma: Secondary | ICD-10-CM

## 2016-08-02 DIAGNOSIS — N183 Chronic kidney disease, stage 3 unspecified: Secondary | ICD-10-CM

## 2016-08-02 DIAGNOSIS — K5010 Crohn's disease of large intestine without complications: Secondary | ICD-10-CM

## 2016-08-02 DIAGNOSIS — I5031 Acute diastolic (congestive) heart failure: Secondary | ICD-10-CM

## 2016-08-02 DIAGNOSIS — M1990 Unspecified osteoarthritis, unspecified site: Secondary | ICD-10-CM

## 2016-08-02 DIAGNOSIS — I10 Essential (primary) hypertension: Secondary | ICD-10-CM | POA: Diagnosis not present

## 2016-08-02 DIAGNOSIS — J189 Pneumonia, unspecified organism: Secondary | ICD-10-CM | POA: Diagnosis not present

## 2016-08-02 DIAGNOSIS — E559 Vitamin D deficiency, unspecified: Secondary | ICD-10-CM | POA: Diagnosis not present

## 2016-08-02 NOTE — Progress Notes (Signed)
Location:   Customer service manager of Service:  SNF ([id number])  Provider:  Cindi Carbon, ANP [place] [phone]   PCP: Alesia Richards, MD [name], MD as PCP - General (Internal Medicine)  Extended Emergency Contact Information Primary Emergency Contact: Alain Marion of [place] Phone: [phone] Mobile Phone: [phone] Relation: Friend Secondary Emergency Contact: Lyon,Nancy Address: [address]          [place] [address] Johnnette Litter of [place] Phone: [phone] Relation: Other  Code Status: DNR Goals of care:  Advanced Directive information Advanced Directives 08/02/2016  Does patient have an advance directive? Yes  Type of Paramedic of [place];Living will;Out of facility DNR (pink MOST or yellow form)  Does patient want to make changes to advanced directive? -  Copy of advanced directive(s) in chart? Yes  Pre-existing out of facility DNR order (yellow form or pink MOST form) Yellow form placed in chart (order not valid for inpatient use);Pink MOST form placed in chart (order not valid for inpatient use)     Allergies  Allergen Reactions  . Ace Inhibitors Other (See Comments)    On [facility]    Chief Complaint  Patient presents with  . Discharge Note    HPI:  82 y.o. female  Here in rehab after a hospitalization for chest pain 07/21/16-07/26/16, now ready for discharge. Diagnosed with bibasilar pna and discharged on Augmentin (completed 8/22).  WBC on admission was 17.1, now 11.4 as of 07/28/16.  Denies productive cough, no fever or further chest pain. Troponins were elevated but not consistent with ACS.    Diagnosed with diastolic CHF during her hospitalization.  Echo on 07/22/16 showed EF of 50-55% with grade II diastolic dysfunction.  Currently on Lasix 20 mg and weight has decreased by 2 lbs as of 8/28, at 142 lbs.    She lives independently and is ambulatory with a  walker. She is continent of urine and stool.  Eating and drinking normally.    Her BP has been elevated and she was placed back on atenolol at her last cards visit. BP today improved at 159/77.    Hx of Crohn's dz, reports intermittent loose stools but no abd pain. Currently on ascal and not followed by GI.  MMSE 26/30, mild deficiency in recall and serial 7's.    Past Medical History:  Diagnosis Date  . Crohn's disease ([place])   . Glaucoma   . Hyperlipidemia   . Hypertension   . Prediabetes   . Vitamin D deficiency     Past Surgical History:  Procedure Laterality Date  . TONSILLECTOMY AND ADENOIDECTOMY        reports that she has never smoked. She has never used smokeless tobacco. She reports that she does not drink alcohol. Her drug history is not on file. Social History   Social History  . Marital status: Widowed    Spouse name: N/A  . Number of children: N/A  . Years of education: N/A   Occupational History  . Not on file.   Social History Main Topics  . Smoking status: Never Smoker  . Smokeless tobacco: Never Used  . Alcohol use No  . Drug use: Unknown  . Sexual activity: Not on file   Other Topics Concern  . Not on file   Social History Narrative  . No narrative on file   Functional Status Survey:    Allergies  Allergen Reactions  . Ace  Inhibitors Other (See Comments)    On MAR    Pertinent  Health Maintenance Due  Topic Date Due  . INFLUENZA VACCINE  07/06/2016  . DEXA SCAN  09/30/2017 (Originally 04/[birth year]/1982)  . PNA vac Low Risk Adult  Completed    Medications:   Medication List       Accurate as of 08/02/16 10:52 AM. Always use your most recent med list.          amLODipine 5 MG tablet Commonly known as:  NORVASC Take 1 tablet (5 mg total) by mouth daily.   aspirin 81 MG chewable tablet Chew by mouth daily.   Cholecalciferol 4000 units Caps Take 2,000 Units by mouth daily.   furosemide 40 MG tablet Commonly known as:   LASIX Take 0.5 tablets (20 mg total) by mouth daily.   latanoprost 0.005 % ophthalmic solution Commonly known as:  XALATAN Place 1 drop into both eyes at bedtime.   Magnesium 250 MG Tabs Take 1 tablet by mouth 2 (two) times daily.   Mesalamine 800 MG Tbec Commonly known as:  ASACOL HD Take 1 tablet (800 mg total) by mouth 2 (two) times daily.   multivitamin-lutein Caps capsule Take 1 capsule by mouth 2 (two) times daily.   potassium chloride 10 MEQ tablet Commonly known as:  K-DUR,KLOR-CON Take 1 tablet (10 mEq total) by mouth daily.   traMADol 50 MG tablet Commonly known as:  ULTRAM TAKE 1 TABLET 3 TIMES DAILY AS NEEDED FOR PAIN.       Review of Systems  Constitutional: Negative for activity change, appetite change, chills, diaphoresis, fatigue, fever and unexpected weight change.  HENT: Negative for congestion.   Respiratory: Negative for cough, shortness of breath and wheezing.   Cardiovascular: Negative for chest pain, palpitations and leg swelling.  Gastrointestinal: Negative for abdominal distention, abdominal pain, constipation and diarrhea.       Intermittent loose stools throughout the day  Genitourinary: Negative for difficulty urinating and dysuria.  Musculoskeletal: Negative for arthralgias, back pain, gait problem, joint swelling and myalgias.  Neurological: Negative for dizziness, tremors, seizures, syncope, facial asymmetry, speech difficulty, weakness, light-headedness, numbness and headaches.  Psychiatric/Behavioral: Negative for agitation, behavioral problems and confusion.    Vitals:   08/02/16 1043  BP: (!) 159/77  Pulse: 88  Resp: 20  Temp: 97.8 F (36.6 C)  SpO2: 95%  Weight: 142 lb 12.8 oz (64.8 kg)   Body mass index is 27.89 kg/m. Physical Exam  Constitutional: She is oriented to person, place, and time. No distress.  HENT:  Head: Normocephalic and atraumatic.  Neck: No JVD present.  Cardiovascular: Normal rate and regular rhythm.    No murmur heard. No edema  Pulmonary/Chest: Effort normal. No respiratory distress. She has wheezes (mild bilat).  Neurological: She is alert and oriented to person, place, and time.  Skin: Skin is warm and dry. She is not diaphoretic.  Psychiatric: She has a normal mood and affect.  Nursing note and vitals reviewed.   Labs reviewed: Basic Metabolic Panel:  Recent Labs  01/01/16 1500  07/24/16 0510 07/25/16 0635 07/26/16 0517 07/28/16  NA 139  < > 134* 135 136 137  K 4.8  < > 3.5 3.4* 4.2 4.6  CL 101  < > 97* 95* 95*  --   CO2 28  < > 28 30 32  --   GLUCOSE 93  < > 118* 107* 123*  --   BUN 31*  < > 38* 31* 33* 37*  CREATININE 1.36*  < > 1.22* 1.20* 1.26* 1.3*  CALCIUM 9.0  < > 8.5* 8.8* 9.3  --   MG 2.2  --   --   --   --   --   < > = values in this interval not displayed. Liver Function Tests:  Recent Labs  01/01/16 1500 07/22/16 0451  AST 15 18  ALT 9 11*  ALKPHOS 59 61  BILITOT 0.5 0.8  PROT 6.9 7.7  ALBUMIN 3.7 3.6   No results for input(s): LIPASE, AMYLASE in the last 8760 hours. No results for input(s): AMMONIA in the last 8760 hours. CBC:  Recent Labs  01/01/16 1500  07/22/16 0200  07/24/16 0510 07/25/16 0635 07/26/16 0517 07/28/16  WBC 8.8  < > 28.8*  < > 12.4* 10.8* 10.3 11.4  NEUTROABS 6.2  --  26.2*  --   --   --   --   --   HGB 13.4  < > 12.9  < > 12.0 12.3 12.8 13.5  HCT 41.2  < > 39.2  < > 36.7 38.9 40.6 46  MCV 83.7  < > 84.8  < > 83.8 85.9 85.8  --   PLT 324  < > 464*  < > 477* 521* 566* 585*  < > = values in this interval not displayed. Cardiac Enzymes:  Recent Labs  07/22/16 0451 07/22/16 1140 07/22/16 1758  TROPONINI 0.04* <0.03 <0.03   BNP: Invalid input(s): POCBNP CBG: No results for input(s): GLUCAP in the last 8760 hours.  Procedures and Imaging Studies During Stay: Dg Chest 2 View  Result Date: 07/21/2016 CLINICAL DATA:  Sudden onset of right upper chest pain and shortness of breath. EXAM: CHEST  2 VIEW COMPARISON:   Two-view chest x-ray 07/24/15 FINDINGS: The heart is enlarged. There is increase in a diffuse interstitial pattern. Airspace opacities are more prominent left than right. Remote right-sided rib fractures are noted. Multilevel degenerative changes are noted in the thoracic spine. IMPRESSION: 1. Interval increase in diffuse interstitial and airspace disease. This likely represents edema. Infection is also considered. 2. Stable cardiomegaly. Electronically Signed   By: San Morelle M.D.   On: 07/21/2016 22:22   Ct Chest Wo Contrast  Result Date: 07/22/2016 CLINICAL DATA:  Initial evaluation for acute sudden onset left-sided chest pain, intermittent in nature. Also with dry cough. EXAM: CT CHEST WITHOUT CONTRAST TECHNIQUE: Multidetector CT imaging of the chest was performed following the standard protocol without IV contrast. COMPARISON:  Prior radiograph from earlier the same day. FINDINGS: Thyroid gland within normal limits. Mildly prominent right paratracheal node measures at the upper limits of normal at 1 cm. Few additional scattered shotty subcentimeter mediastinal lymph nodes present. Node definite pathologically enlarged mediastinal, hilar, or axillary lymph nodes identified on this noncontrast examination. Scattered atheromatous plaque within the aortic arch in at the origin of the great vessels. The aorta itself within normal limits for size without evidence for aneurysm. Moderate cardiomegaly present. Prominent aortic and mitral valvular calcifications. Scattered coronary artery calcifications present as well. Small to moderate pericardial effusion. Pulmonary arteries not well evaluated on this noncontrast examination. Evaluation of lungs mildly limited by motion artifact. Mild atelectatic changes present dependently within both lower lobes. Patchy and linear opacities within the lingula also old largely favored to reflect atelectasis. Superimposed scattered areas of bronchiectasis present within  the lingula. There are scattered tree-in-bud nodular densities within the right upper lobe, suspicious for possible small airways disease/endobronchial infection. Similar but less severe densities  present within the left upper lobe as well. Superimposed bronchiectasis within the right upper lobe. No frank consolidative airspace disease or air bronchograms to suggest pneumonia. No significant interlobular septal thickening to suggest pulmonary edema. No pleural effusion. No pneumothorax. No worrisome pulmonary nodule or mass. Visualized portions of the upper abdomen demonstrate no acute abnormality. Small hiatal hernia noted. Prominent atheromatous plaque within the visualized intra-abdominal aorta and its branch vessels. No acute osseous abnormality. Few scatter remotely healed right-sided rib fractures noted. Fractures No worrisome lytic or blastic osseous lesions. Accentuation the normal thoracic kyphosis with scattered multilevel degenerative spondylolysis. IMPRESSION: 1. Mild scattered nodular tree-in-bud opacities involving the bilateral upper lobes, right worse than left, suspicious for possible acute endobronchial infection/small airways disease in the setting of cough. 2. Moderate cardiomegaly without pulmonary edema. 3. Small to moderate pericardial effusion. 4. Prominent aortic and mitral valvular calcifications, with prominent coronary and aortic atherosclerotic disease. Electronically Signed   By: Jeannine Boga M.D.   On: 07/22/2016 13:53    Assessment/Plan:   1. CAP (community acquired pneumonia) Resolved, WBC trended down during her stay with  No productive cough or sob  2. Essential hypertension Improved Continue Norvasc 5 mg qd, Atenolol 25 mg qd, and Lasix 20 mg qd  3. Acute diastolic heart failure (HCC) -Improved -weight down 2 lbs -continue Lasix 20 mg qd  4. Crohn's disease of large intestine without complication ([place]) -some loose stools here in rehab but no pain or  fever -continue Ascal and if symptoms do not resolved would refer back to GI or PCP  5. Osteoarthritis, unspecified osteoarthritis type, unspecified site -Stable -Continue ultram 50 mg TID prn  6. Vitamin D deficiency Continue Vit D 2000 units qd  7. Glaucoma Continue xalatan   8. CKD Stage III -slight increase in BUN/CR during her stay -resident reports drinking less fluids to avoid going to the BR frequently -She was encourage to maintain hydration   Resident is ready for discharge. Her POA will help fill her pill bottles but she is otherwise independent with ADL's, able to prepare meals or use [facility].  Patient is being discharged with the following home health services:  NA  Patient is being discharged with the following durable medical equipment:  NA  She will follow up with cards in 3 months.  Has a most form indicating no further hospitalizations.  PCP apt due in Jan.    Future labs/tests needed:  NA

## 2016-08-03 ENCOUNTER — Other Ambulatory Visit: Payer: Self-pay | Admitting: Cardiovascular Disease

## 2016-08-03 ENCOUNTER — Telehealth: Payer: Self-pay

## 2016-08-03 MED ORDER — FUROSEMIDE 40 MG PO TABS: 20.0000 mg | ORAL_TABLET | Freq: Every day | ORAL | 11 refills | Status: DC

## 2016-08-04 ENCOUNTER — Telehealth: Payer: Self-pay | Admitting: Physician Assistant

## 2016-08-04 DIAGNOSIS — K5010 Crohn's disease of large intestine without complications: Secondary | ICD-10-CM

## 2016-08-04 MED ORDER — MESALAMINE 800 MG PO TBEC: 800.0000 mg | DELAYED_RELEASE_TABLET | Freq: Two times a day (BID) | ORAL | 3 refills | Status: DC

## 2016-08-04 NOTE — Telephone Encounter (Signed)
[name] is calling in regards to Courtney Fuller. She was recently in the hospital for a pneumonia and has been having diarrhea since being out. She is on antibiotics. She can increase her Asacol to twice a day and up to 3 x a day if needed, new RX sent in however with recent hospital visit and ABX want stool sample to rule out Cdiff. Suggest Ov if not better.

## 2016-08-05 ENCOUNTER — Other Ambulatory Visit: Payer: Self-pay | Admitting: *Deleted

## 2016-08-05 ENCOUNTER — Other Ambulatory Visit: Payer: Self-pay | Admitting: Cardiovascular Disease

## 2016-08-05 MED ORDER — FUROSEMIDE 20 MG PO TABS: 20.0000 mg | ORAL_TABLET | Freq: Every day | ORAL | 3 refills | Status: DC

## 2016-08-05 NOTE — Telephone Encounter (Signed)
Spoke w/ pt about increasing her ASACOL, pt agreed to the change & stated that she believes this is why she had to go ER anyway, she states she wasn't taking enough of the ASACOL. A message was left to front office to schedule a follow up ER visit & to collect a stool sample.

## 2016-08-05 NOTE — Progress Notes (Signed)
  Prescription for furosemide sent to pharmacy again

## 2016-08-05 NOTE — Telephone Encounter (Signed)
PER DR CROITORU , PRESCRIPTION E-SENT TO PHARMACY. LASIX 20 MG ONE TABLET DAILY #90 WITH 3 REFILL

## 2016-08-06 ENCOUNTER — Telehealth: Payer: Self-pay

## 2016-08-06 DIAGNOSIS — Z85828 Personal history of other malignant neoplasm of skin: Secondary | ICD-10-CM | POA: Diagnosis not present

## 2016-08-06 DIAGNOSIS — R21 Rash and other nonspecific skin eruption: Secondary | ICD-10-CM | POA: Diagnosis not present

## 2016-08-06 MED ORDER — ATENOLOL 50 MG PO TABS: 50.0000 mg | ORAL_TABLET | Freq: Every day | ORAL | 3 refills | Status: DC

## 2016-08-06 NOTE — Telephone Encounter (Signed)
Dr C increase patient's atenolol to 50 mg daily.

## 2016-08-12 ENCOUNTER — Telehealth: Payer: Self-pay | Admitting: Cardiovascular Disease

## 2016-08-12 NOTE — Telephone Encounter (Signed)
Agree with advice on furosemide. Let's cut the atenolol back to 25 mg daily again, also. Spoke to [employer] by phone. Please let Mliss Sax know also.

## 2016-08-12 NOTE — Telephone Encounter (Signed)
Returned call - spoke to [place], home health RN w [facility]. She notes she saw patient today, who was visibly short of breath when walking in home. Bernadette counted a RR 32 at rest after 10 mins observation. Also noted 122/60 92 HR 97.5 temp 94-95% spO2  Unsure if any leg swelling but notes on auscultation she could hear fluid in lung bases.   I advised increase in lasix from 20mg  to 40mg  daily in AM x3 days if no improvement or worse symptoms, send patient to ED [facility] informs me she will communicate this advice to POA She notes earlier she left msg for patient's POA to call. POA controls access to pills (due to prior med admin errors by patient) Nurse waiting to hear back from them.  She is aware that I will route to provider and call back w/ any further recommendations or corrections to care plan.

## 2016-08-12 NOTE — Telephone Encounter (Signed)
Courtney Fuller [facility]) is calling because Mrs. Luttrell is having some shortness of breath and her respiration is 42.  Please call   Thanks

## 2016-08-12 NOTE — Telephone Encounter (Signed)
Notified Mliss Sax, [facility] of plan - she voiced thanks and will update pt notes accordingly. Aware to call if further concerns or if other patient needs identified.

## 2016-08-23 ENCOUNTER — Telehealth: Payer: Self-pay | Admitting: *Deleted

## 2016-08-23 MED ORDER — POTASSIUM CHLORIDE CRYS ER 10 MEQ PO TBCR: 10.0000 meq | EXTENDED_RELEASE_TABLET | Freq: Every day | ORAL | 3 refills | Status: DC

## 2016-08-23 NOTE — Telephone Encounter (Signed)
-----   Message from Sanda Klein, MD sent at 08/23/2016  9:31 AM EDT ----- Can you please refill her KCl at [facility]?  I screwed it up last time I tried... Thanks! MCr

## 2016-08-23 NOTE — Telephone Encounter (Signed)
Prescription e-sent to pharmacy  90 day supply. 3 refills

## 2016-09-07 DIAGNOSIS — L84 Corns and callosities: Secondary | ICD-10-CM | POA: Diagnosis not present

## 2016-09-07 DIAGNOSIS — M79672 Pain in left foot: Secondary | ICD-10-CM | POA: Diagnosis not present

## 2016-09-07 DIAGNOSIS — M79671 Pain in right foot: Secondary | ICD-10-CM | POA: Diagnosis not present

## 2016-09-07 DIAGNOSIS — L602 Onychogryphosis: Secondary | ICD-10-CM | POA: Diagnosis not present

## 2016-11-03 ENCOUNTER — Ambulatory Visit (INDEPENDENT_AMBULATORY_CARE_PROVIDER_SITE_OTHER): Payer: Commercial Managed Care - HMO | Admitting: Cardiovascular Disease

## 2016-11-03 ENCOUNTER — Encounter: Payer: Self-pay | Admitting: Cardiovascular Disease

## 2016-11-03 VITALS — BP 137/70 | HR 90 | Ht 60.0 in | Wt 141.0 lb

## 2016-11-03 DIAGNOSIS — I10 Essential (primary) hypertension: Secondary | ICD-10-CM

## 2016-11-03 DIAGNOSIS — I5032 Chronic diastolic (congestive) heart failure: Secondary | ICD-10-CM | POA: Diagnosis not present

## 2016-11-03 DIAGNOSIS — I2510 Atherosclerotic heart disease of native coronary artery without angina pectoris: Secondary | ICD-10-CM | POA: Diagnosis not present

## 2016-11-03 DIAGNOSIS — E7800 Pure hypercholesterolemia, unspecified: Secondary | ICD-10-CM

## 2016-11-03 DIAGNOSIS — I350 Nonrheumatic aortic (valve) stenosis: Secondary | ICD-10-CM

## 2016-11-03 NOTE — Progress Notes (Signed)
Cardiology Office Note    Date:  11/03/2016   ID:  Courtney Fuller, DOB 04-12-1915, MRN [id number]  PCP:  Alesia Richards, MD  Cardiologist:   Sanda Klein, MD   Chief Complaint  Patient presents with  . Follow-up    History of Present Illness:  Courtney Fuller is a 82 y.o. female with hypertension and Chronic diastolic heart failure She is accompanied by Courtney Fuller who has medical power of attorney. Courtney Fuller does not have any surviving relatives and Courtney Fuller is the daughter-in-law of Courtney Fuller's lifelong friend, Courtney Fuller (who also recently passed). Courtney Fuller remains fully alert and oriented and is capable of making decisions for herself. She has affirmed a DO NOT RESUSCITATE status. She is a resident in assisted living at [facility].  Although she has not had a complete recovery to the same level of functioning before her acute decompensation last August, she has had substantial improvement. She is able to walk without assistance around the complex, but is reluctant to leave the assisted living facility.  [name] has been keeping a detailed record of her blood pressure. This is often a little elevated, but typically in the Fuller 130s/70s. Her weight has been very steady, around 140 pounds. She has had mild ankle swelling.  Recent hospitalization August 2017: - CT chest: moderate cardiomegaly without overt pulmonary edema, a small to moderate pericardial effusion and extensive calcification in the heart valves and coronary arteries.  - Echocardiography: pericardial effusion is not a significant abnormality, severe LVH with LVEF of 50-55% and normal regional wall motion, degenerative mild aortic valve stenosis and severe mitral annular calcification without stenosis. The left atrium was severely dilated.  - BNP was markedly elevated at almost 600 (no baseline available), cardiac troponin was insignificantly elevated on one of 5 assays only.  - Electrocardiogram does not show acute ischemic  abnormalities.   Past Medical History:  Diagnosis Date  . Crohn's disease ([place])   . Glaucoma   . Hyperlipidemia   . Hypertension   . Prediabetes   . Vitamin D deficiency     Past Surgical History:  Procedure Laterality Date  . TONSILLECTOMY AND ADENOIDECTOMY      Current Medications: Outpatient Medications Prior to Visit  Medication Sig Dispense Refill  . amLODipine (NORVASC) 5 MG tablet Take 1 tablet (5 mg total) by mouth daily. 90 tablet 3  . aspirin 81 MG chewable tablet Chew by mouth daily.    . Cholecalciferol 4000 UNITS CAPS Take 2,000 Units by mouth daily.     . furosemide (LASIX) 20 MG tablet Take 1 tablet (20 mg total) by mouth daily. 90 tablet 3  . latanoprost (XALATAN) 0.005 % ophthalmic solution Place 1 drop into both eyes at bedtime.     . Magnesium 250 MG TABS Take 1 tablet by mouth 2 (two) times daily.    . Mesalamine (ASACOL HD) 800 MG TBEC Take 1 tablet (800 mg total) by mouth 2 (two) times daily. 180 tablet 3  . multivitamin-lutein (OCUVITE-LUTEIN) CAPS capsule Take 1 capsule by mouth 2 (two) times daily.    . potassium chloride (K-DUR,KLOR-CON) 10 MEQ tablet Take 1 tablet (10 mEq total) by mouth daily. 90 tablet 3  . atenolol (TENORMIN) 50 MG tablet Take 1 tablet (50 mg total) by mouth daily. (Patient taking differently: Take 25 mg by mouth daily. ) 90 tablet 3   No facility-administered medications prior to visit.      Allergies:   Ace inhibitors   Social History  Social History  . Marital status: Widowed    Spouse name: N/A  . Number of children: N/A  . Years of education: N/A   Social History Main Topics  . Smoking status: Never Smoker  . Smokeless tobacco: Never Used  . Alcohol use No  . Drug use: Unknown  . Sexual activity: Not Asked   Other Topics Concern  . None   Social History Narrative  . None     Family History:  The patient's family history includes Stroke in her father.   ROS:   Please see the history of present illness.      ROS All other systems reviewed and are negative.   PHYSICAL EXAM:   VS:  BP 137/70 (BP Location: Right Arm, Patient Position: Sitting, Cuff Size: Normal)   Pulse 90   Ht 5' (1.524 m)   Wt 141 lb (64 kg)   SpO2 96%   BMI 27.54 kg/m    GEN: Well nourished, well developed, in no acute distress  HEENT: normal  Neck: no JVD, carotid bruits, or masses Cardiac: RRR; 2/6 aortic ejection murmur; no diastolic murmurs, rubs, or gallops,no edema  Respiratory:  She has persistent dry crackles in her right base, otherwise clear to auscultation bilaterally, normal work of breathing GI: soft, nontender, nondistended, + BS MS: no deformity or atrophy  Skin: warm and dry, no rash Neuro:  Alert and Oriented x 3, Strength and sensation are intact Psych: euthymic mood, full affect  Wt Readings from Last 3 Encounters:  11/03/16 141 lb (64 kg)  08/02/16 142 lb 12.8 oz (64.8 kg)  07/28/16 141 lb (64 kg)      Studies/Labs Reviewed:   EKG:  EKG is not ordered today.  Recent Labs: 01/01/2016: Magnesium 2.2 07/22/2016: ALT 11; B Natriuretic Peptide 564.0; TSH 2.299 07/28/2016: BUN 37; Creatinine 1.3; Hemoglobin 13.5; Platelets 585; Potassium 4.6; Sodium 137   Lipid Panel    Component Value Date/Time   CHOL 203 (H) 12/25/2014 1513   TRIG 170 (H) 12/25/2014 1513   HDL 42 12/25/2014 1513   CHOLHDL 4.8 12/25/2014 1513   VLDL 34 12/25/2014 1513   LDLCALC 127 (H) 12/25/2014 1513     ASSESSMENT:    No diagnosis found.   PLAN:  In order of problems listed above:  1. CHF: Appears to have resolved, seems to be euvolemic today. Likely has underlying hypertensive heart disease, maybe also cardiac amyloidosis (supported by relatively low QRS voltage in a patient with severe LVH) and quite likely has multivessel CAD. She is almost 82 years old. Plan conservative treatment with diuretics and judicious blood pressure control. 2. HTN: Blood pressure just a little Fuller and only intermittently. Since I  worry that she may have cardiac amyloidosis, would like to avoid excessive reduction in heart rate. She may have a limited ability for diastolic filling and limited stroke volume. No changes made to medications today. 3. Coronary disease: Suspected, but without evidence of coronary insufficiency clinically or by ECG. Recommend very conservative management in this elderly patient. Continue aspirin. 4. AS: Does not appear to be hemodynamically significant 5. HLP: Mild. No symptoms of angina or other vascular disease. Although there is evidence of atherosclerosis by imaging studies, I find it doubtful that treatment with a statin will make a big difference in this near-centenarian.  We'll follow-up in approximately 6 months. [name] will let me know if she has new symptoms of her blood pressure becomes poorly controlled.   Medication Adjustments/Labs and Tests  Ordered: Current medicines are reviewed at length with the patient today.  Concerns regarding medicines are outlined above.  Medication changes, Labs and Tests ordered today are listed in the Patient Instructions below.   Signed, Sanda Klein, MD  11/03/2016 7:37 PM    [address], [place], [place]  [address] Phone: [phone]; Fax: [phone]

## 2016-11-03 NOTE — Patient Instructions (Signed)
Dr Croitoru recommends that you schedule a follow-up appointment in 6 months. You will receive a reminder letter in the mail two months in advance. If you don't receive a letter, please call our office to schedule the follow-up appointment.  If you need a refill on your cardiac medications before your next appointment, please call your pharmacy. 

## 2016-12-28 DIAGNOSIS — M79672 Pain in left foot: Secondary | ICD-10-CM | POA: Diagnosis not present

## 2016-12-28 DIAGNOSIS — M79671 Pain in right foot: Secondary | ICD-10-CM | POA: Diagnosis not present

## 2016-12-28 DIAGNOSIS — L602 Onychogryphosis: Secondary | ICD-10-CM | POA: Diagnosis not present

## 2016-12-28 DIAGNOSIS — L84 Corns and callosities: Secondary | ICD-10-CM | POA: Diagnosis not present

## 2017-01-03 DIAGNOSIS — H401132 Primary open-angle glaucoma, bilateral, moderate stage: Secondary | ICD-10-CM | POA: Diagnosis not present

## 2017-01-03 DIAGNOSIS — H353222 Exudative age-related macular degeneration, left eye, with inactive choroidal neovascularization: Secondary | ICD-10-CM | POA: Diagnosis not present

## 2017-01-03 DIAGNOSIS — H353134 Nonexudative age-related macular degeneration, bilateral, advanced atrophic with subfoveal involvement: Secondary | ICD-10-CM | POA: Diagnosis not present

## 2017-01-03 DIAGNOSIS — H35363 Drusen (degenerative) of macula, bilateral: Secondary | ICD-10-CM | POA: Diagnosis not present

## 2017-01-04 ENCOUNTER — Encounter: Payer: Self-pay | Admitting: Physician Assistant

## 2017-01-12 ENCOUNTER — Ambulatory Visit (INDEPENDENT_AMBULATORY_CARE_PROVIDER_SITE_OTHER): Payer: Medicare HMO | Admitting: Physician Assistant

## 2017-01-12 ENCOUNTER — Encounter: Payer: Self-pay | Admitting: Physician Assistant

## 2017-01-12 VITALS — BP 130/76 | HR 68 | Temp 97.6°F | Resp 14 | Ht 60.0 in | Wt 144.2 lb

## 2017-01-12 DIAGNOSIS — M159 Polyosteoarthritis, unspecified: Secondary | ICD-10-CM

## 2017-01-12 DIAGNOSIS — I5032 Chronic diastolic (congestive) heart failure: Secondary | ICD-10-CM | POA: Diagnosis not present

## 2017-01-12 DIAGNOSIS — I10 Essential (primary) hypertension: Secondary | ICD-10-CM

## 2017-01-12 DIAGNOSIS — E559 Vitamin D deficiency, unspecified: Secondary | ICD-10-CM | POA: Diagnosis not present

## 2017-01-12 DIAGNOSIS — H409 Unspecified glaucoma: Secondary | ICD-10-CM | POA: Diagnosis not present

## 2017-01-12 DIAGNOSIS — R7303 Prediabetes: Secondary | ICD-10-CM

## 2017-01-12 DIAGNOSIS — K5010 Crohn's disease of large intestine without complications: Secondary | ICD-10-CM | POA: Diagnosis not present

## 2017-01-12 DIAGNOSIS — N183 Chronic kidney disease, stage 3 unspecified: Secondary | ICD-10-CM

## 2017-01-12 DIAGNOSIS — Z0000 Encounter for general adult medical examination without abnormal findings: Secondary | ICD-10-CM | POA: Diagnosis not present

## 2017-01-12 DIAGNOSIS — Z79899 Other long term (current) drug therapy: Secondary | ICD-10-CM

## 2017-01-12 DIAGNOSIS — I350 Nonrheumatic aortic (valve) stenosis: Secondary | ICD-10-CM

## 2017-01-12 DIAGNOSIS — E7800 Pure hypercholesterolemia, unspecified: Secondary | ICD-10-CM | POA: Diagnosis not present

## 2017-01-12 DIAGNOSIS — H3530 Unspecified macular degeneration: Secondary | ICD-10-CM

## 2017-01-12 MED ORDER — TRAMADOL HCL 50 MG PO TABS: ORAL_TABLET | ORAL | 2 refills | Status: DC

## 2017-01-12 NOTE — Progress Notes (Addendum)
Wellness AND FOLLOW UP  Assessment:    Essential hypertension - atenolol (TENORMIN) 50 MG tablet; TAKE 1 TABLET 3 (THREE) TIMES DAILY.  Dispense: 270 tablet; Refill: 2 - CBC with Differential - BASIC METABOLIC PANEL WITH GFR - Hepatic function panel - TSH  Crohn's disease, without complications -Asacol 941DE BID   Hyperlipidemia -decrease fatty foods, increase activity.   Vitamin D deficiency Continue supplement   Glaucoma Continue follow up  Macular degeneration Continue follow up   CKD (chronic kidney disease) stage 3, GFR 30-59 ml/min Increase fluids, avoid NSAIDS/take sparingly, monitor sugars, will monitor  Medication management - Magnesium- cut back to 436m daily   OA Tramadol PRN  Chronic diastolic heart failure (HCC) Monitor weight   Plan:   During the course of the visit the patient was educated and counseled about appropriate screening and preventive services including:    Pneumococcal vaccine   Influenza vaccine  Td vaccine  Screening electrocardiogram  Bone densitometry screening  Colorectal cancer screening  Diabetes screening  Glaucoma screening  Nutrition counseling   Advanced directives: requested  Subjective:  Courtney ALLERTONis a 83y.o. female who presents for medicare wellness and follow up for HTN, chol.    Her blood pressure has been controlled at home, she has been controlled, will start checking once a month, today their BP is BP: 130/76  Courtney Jarvisis here today with her, daughter in low of Emiah's lifelong friend.  She has diastolic CHF, follows with Dr. CSallyanne Kuster is on lasix and weight is stable.  Wt Readings from Last 3 Encounters:  01/12/17 144 lb 3.2 oz (65.4 kg)  11/03/16 141 lb (64 kg)  08/02/16 142 lb 12.8 oz (64.8 kg)   She does not workout. She denies chest pain, shortness of breath, dizziness.  She is not on cholesterol medication and denies myalgias. Her cholesterol is at goal. The cholesterol last visit was:    Lab Results  Component Value Date   CHOL 203 (H) 12/25/2014   HDL 42 12/25/2014   LDLCALC 127 (H) 12/25/2014   TRIG 170 (H) 12/25/2014   CHOLHDL 4.8 12/25/2014   She has been working on diet and exercise for prediabetes, and denies paresthesia of the feet, polydipsia, polyuria and visual disturbances. A1C was 6.0 in the 2014. Last A1C in the office was:  Lab Results  Component Value Date   HGBA1C 5.5 12/19/2013  Patient is on Vitamin D supplement.   She has CKD due to age/HTN Lab Results  Component Value Date   GFRNONAA 34 (L) 07/26/2016  She follows with Dr. GKaty Fitchand Dr. RZadie Rhinefor Mac DMargie Egeand GStephenie Acresbut she is no longer going.  She has crohn's disease and is on lialda for this and has done well on this for many years and would like to stay on this.  No aches and pain except her feet, she will use aleve for this occ.  [facility]   BMI is Body mass index is 28.16 kg/m., she is working on diet and exercise. Wt Readings from Last 3 Encounters:  01/12/17 144 lb 3.2 oz (65.4 kg)  11/03/16 141 lb (64 kg)  08/02/16 142 lb 12.8 oz (64.8 kg)     Names of Other Physician/Practitioners you currently use: 1. [facility] here for primary care 2. Dr. GKaty Fitch eye doctor, q 6 months 3. Dr. RZadie Rhineq 6 months Patient Care Team: WUnk Pinto MD as PCP -  General (Internal Medicine)   Medication Review: Current Outpatient Prescriptions on File Prior to Visit  Medication Sig Dispense Refill  . amLODipine (NORVASC) 5 MG tablet Take 1 tablet (5 mg total) by mouth daily. 90 tablet 3  . aspirin 81 MG chewable tablet Chew by mouth daily.    Marland Kitchen atenolol (TENORMIN) 25 MG tablet Take 25 mg by mouth daily.    . Cholecalciferol 4000 UNITS CAPS Take 2,000 Units by mouth daily.     Marland Kitchen latanoprost (XALATAN) 0.005 % ophthalmic solution Place 1 drop into both eyes at bedtime.     . Magnesium 250 MG TABS Take 1 tablet by  mouth 2 (two) times daily.    . Mesalamine (ASACOL HD) 800 MG TBEC Take 1 tablet (800 mg total) by mouth 2 (two) times daily. 180 tablet 3  . multivitamin-lutein (OCUVITE-LUTEIN) CAPS capsule Take 1 capsule by mouth 2 (two) times daily.    . potassium chloride (K-DUR,KLOR-CON) 10 MEQ tablet Take 1 tablet (10 mEq total) by mouth daily. 90 tablet 3  . furosemide (LASIX) 20 MG tablet Take 1 tablet (20 mg total) by mouth daily. 90 tablet 3   No current facility-administered medications on file prior to visit.     Current Problems (verified) Patient Active Problem List   Diagnosis Date Noted  . Coronary artery calcification seen on CT scan 07/30/2016  . Mild aortic stenosis 07/30/2016  . Leukocytosis 07/22/2016  . Chronic diastolic heart failure ([place]) 07/22/2016  . Osteoarthritis 01/01/2016  . Encounter for Medicare annual wellness exam 07/03/2015  . Macular degeneration 12/25/2014  . CKD (chronic kidney disease) stage 3, GFR 30-59 ml/min 12/25/2014  . Hyperlipidemia   . Hypertension   . Prediabetes   . Vitamin D deficiency   . Glaucoma   . Crohn's disease ([place])    Screening Tests Immunization History  Administered Date(s) Administered  . Influenza, High Dose Seasonal PF 09/04/2015  . Influenza-Unspecified 09/18/2013, 09/30/2016  . Pneumococcal Conjugate-13 01/01/2016  . Pneumococcal Polysaccharide-23 09/04/2003  . Tdap 12/13/2012  . Zoster 09/28/2006   Preventative care: Tetanus: 2014 declines another Pneumovax: 2004 declines another Prevnar 13: Will get today Flu vaccine: 09/2015 Zostavax: 2007 Pap: declines MGM: declines DEXA: declines Colonoscopy: 2004- declines another due to age EGD: declines  CXR: 2009  Allergies Allergies  Allergen Reactions  . Ace Inhibitors Other (See Comments)    On MAR    SURGICAL HISTORY She  has a past surgical history that includes Tonsillectomy and adenoidectomy. FAMILY HISTORY Her family history includes Stroke in her  father. SOCIAL HISTORY She  reports that she has never smoked. She has never used smokeless tobacco. She reports that she does not drink alcohol.   MEDICARE WELLNESS OBJECTIVES: Physical activity:   Cardiac risk factors:   Depression/mood screen:   Depression screen [facility] 2/9 01/01/2016  Decreased Interest 0  Down, Depressed, Hopeless 0  PHQ - 2 Score 0    ADLs:  In your present state of health, do you have any difficulty performing the following activities: 07/22/2016 07/22/2016  Hearing? - N  Vision? - N  Difficulty concentrating or making decisions? - N  Walking or climbing stairs? - N  Dressing or bathing? - N  Doing errands, shopping? Y -  Some recent data might be hidden     Cognitive Testing  Alert? Yes  Normal Appearance?Yes  Oriented to person? Yes  Place? Yes   Time? Yes  Recall of three objects?  Yes  Can perform simple calculations?  Yes  Displays appropriate judgment?Yes  Can read the correct time from a watch face?Yes  EOL planning:    Review of Systems  Constitutional: Negative.   HENT: Negative.   Eyes: Negative.   Respiratory: Negative.   Cardiovascular: Negative.   Gastrointestinal: Positive for diarrhea.  Genitourinary: Negative.   Musculoskeletal: Negative.   Skin: Negative.   Neurological: Negative.   Endo/Heme/Allergies: Negative.   Psychiatric/Behavioral: Negative.      Objective:     Blood pressure 130/76, pulse 68, temperature 97.6 F (36.4 C), resp. rate 14, height 5' (1.524 m), weight 144 lb 3.2 oz (65.4 kg), SpO2 97 %. Body mass index is 28.16 kg/m.  General appearance: alert, no distress, WD/WN, female HEENT: normocephalic, sclerae anicteric, TMs pearly, nares patent, no discharge or erythema, pharynx normal Oral cavity: MMM, no lesions Neck: supple, no lymphadenopathy, no thyromegaly, no masses Heart: RRR, normal S1, S2, no murmurs Lungs: CTA bilaterally, no wheezes, rhonchi, or rales Abdomen: +bs, soft, non tender, non  distended, no masses, no hepatomegaly, no splenomegaly Musculoskeletal: nontender, no swelling, no obvious deformity Extremities: no edema, no cyanosis, no clubbing Pulses: 2+ symmetric, upper and lower extremities, normal cap refill Neurological: alert, oriented x 3, CN2-12 intact, strength normal upper extremities and lower extremities, sensation normal throughout, DTRs 2+ throughout, no cerebellar signs, gait slow Skin: scaly erythematous bump on forehead, unchanged Psychiatric: normal affect, behavior normal, pleasant   Medicare Attestation I have personally reviewed: The patient's medical and social history Their use of alcohol, tobacco or illicit drugs Their current medications and supplements The patient's functional ability including ADLs,fall risks, home safety risks, cognitive, and hearing and visual impairment Diet and physical activities Evidence for depression or mood disorders  The patient's weight, height, BMI, and visual acuity have been recorded in the chart.  I have made referrals, counseling, and provided education to the patient based on review of the above and I have provided the patient with a written personalized care plan for preventive services.      Vicie Mutters, PA-C   01/12/2017

## 2017-01-12 NOTE — Patient Instructions (Addendum)
If you are having diarrhea you can take your tramadol for your pain and your diarrhea  Can take amitiza as needed for constipation.  Or you can try magnesium  Remember that you do not need a bowel movement daily, as long as you are not having any stomach pain with it or heavy straining with it.    Chronic Diarrhea Diarrhea is frequent loose and watery bowel movements. It can cause you to feel weak and dehydrated. Dehydration can cause you to become tired and thirsty and to have a dry mouth, decreased urination, and dark yellow urine. Diarrhea is a sign of another problem, most often an infection that will not last long. In most cases, diarrhea lasts 2-3 days. Diarrhea that lasts longer than 4 weeks is called long-lasting (chronic) diarrhea. It is important to treat your diarrhea as directed by your health care provider to lessen or prevent future episodes of diarrhea.  CAUSES  There are many causes of chronic diarrhea. The following are some possible causes:   Gastrointestinal infections caused by viruses, bacteria, or parasites.   Food poisoning or food allergies.   Certain medicines, such as antibiotics, chemotherapy, and laxatives.   Artificial sweeteners and fructose.   Digestive disorders, such as celiac disease and inflammatory bowel diseases.   Irritable bowel syndrome.  Some disorders of the pancreas.  Disorders of the thyroid.  Reduced blood flow to the intestines.  Cancer. Sometimes the cause of chronic diarrhea is unknown. RISK FACTORS  Having a severely weakened immune system, such as from HIV or AIDS.   Taking certain types of cancer-fighting drugs (such as with chemotherapy) or other medicines.   Having had a recent organ transplant.   Having a portion of the stomach or small bowel removed.   Traveling to countries where food and water supplies are often contaminated.  SYMPTOMS  In addition to frequent, loose stools, diarrhea may cause:    Cramping.   Abdominal pain.   Nausea.   Fever.  Fatigue.  Urgent need to use the bathroom.  Loss of bowel control. DIAGNOSIS  Your health care provider must take a careful history and perform a physical exam. Tests given are based on your symptoms and history. Tests may include:   Blood or stool tests. Three or more stool samples may be examined. Stool cultures may be used to test for bacteria or parasites.   X-rays.   A procedure in which a thin tube is inserted into the mouth or rectum (endoscopy). This allows the health care provider to look inside the intestine.  TREATMENT   Treatment is aimed at correcting the cause of the diarrhea when possible.  Diarrhea caused by an infection can often be treated with antibiotic medicines.  Diarrhea not caused by an infection may require you to take long-term medicine or have surgery. Specific treatment should be discussed with your health care provider.  If the cause cannot be determined, treatment aims to relieve symptoms and prevent dehydration. Serious health problems can occur if you do not maintain proper fluid levels. Treatment may include:  Taking an oral rehydration solution (ORS).  Not drinking beverages that contain caffeine (such as tea, coffee, and soft drinks).  Not drinking alcohol.  Maintaining well-balanced nutrition to help you recover faster. HOME CARE INSTRUCTIONS   Drink enough fluids to keep urine clear or pale yellow. Drink 1 cup (8 oz) of fluid for each diarrhea episode. Avoid fluids that contain simple sugars, fruit juices, whole milk products, and sodas. Hydrate  with an ORS. You may purchase the ORS or prepare it at home by mixing the following ingredients together:  ?-? tsp (1.7-3 ? mL) table salt.   tsp (3  mL) baking soda.  ? tsp (1.7 mL) salt substitute containing potassium chloride.  1 ? tbsp (20 mL) sugar.  4.2 c (1 L) of water.   Certain foods and beverages may increase the  speed at which food moves through the gastrointestinal (GI) tract. These foods and beverages should be avoided. They include:  Caffeinated and alcoholic beverages.  High-fiber foods, such as raw fruits and vegetables, nuts, seeds, and whole grain breads and cereals.  Foods and beverages sweetened with sugar alcohols, such as xylitol, sorbitol, and mannitol.   Some foods may be well tolerated and may help thicken stool. These include:  Starchy foods, such as rice, toast, pasta, low-sugar cereal, oatmeal, grits, baked potatoes, crackers, and bagels.  Bananas.  Applesauce.  Add probiotic-rich foods to help increase healthy bacteria in the GI tract. These include yogurt and fermented milk products.  Wash your hands well after each diarrhea episode.  Only take over-the-counter or prescription medicines as directed by your health care provider.  Take a warm bath to relieve any burning or pain from frequent diarrhea episodes. SEEK MEDICAL CARE IF:   You are not urinating as often.  Your urine is a dark color.  You become very tired or dizzy.  You have severe pain in the abdomen or rectum.  Your have blood or pus in your stools.  Your stools look black and tarry. SEEK IMMEDIATE MEDICAL CARE IF:   You are unable to keep fluids down.  You have persistent vomiting.  You have blood in your stool.  Your stools are black and tarry.  You do not urinate in 6-8 hours, or there is only a small amount of very dark urine.  You have abdominal pain that increases or localizes.  You have weakness, dizziness, confusion, or lightheadedness.  You have a severe headache.  Your diarrhea gets worse or does not get better.  You have a fever or persistent symptoms for more than 2-3 days.  You have a fever and your symptoms suddenly get worse. MAKE SURE YOU:   Understand these instructions.  Will watch your condition.  Will get help right away if you are not doing well or get  worse. This information is not intended to replace advice given to you by your health care provider. Make sure you discuss any questions you have with your health care provider. Document Released: 02/12/2004 Document Revised: 11/27/2013 Document Reviewed: 05/17/2013 Elsevier Interactive Patient Education  2017 [employer].

## 2017-01-13 NOTE — Progress Notes (Signed)
Tramadol was called into pharmacy @ 8;28am on 8th Feb 2018 by DD

## 2017-01-20 ENCOUNTER — Other Ambulatory Visit: Payer: Self-pay | Admitting: Physician Assistant

## 2017-01-20 MED ORDER — LUBIPROSTONE 24 MCG PO CAPS: 24.0000 ug | ORAL_CAPSULE | Freq: Two times a day (BID) | ORAL | 4 refills | Status: DC

## 2017-01-24 ENCOUNTER — Other Ambulatory Visit: Payer: Self-pay | Admitting: Physician Assistant

## 2017-01-24 MED ORDER — MESALAMINE ER 500 MG PO CPCR: 500.0000 mg | ORAL_CAPSULE | Freq: Four times a day (QID) | ORAL | 0 refills | Status: DC

## 2017-02-17 ENCOUNTER — Other Ambulatory Visit: Payer: Self-pay | Admitting: Physician Assistant

## 2017-02-17 DIAGNOSIS — I10 Essential (primary) hypertension: Secondary | ICD-10-CM

## 2017-03-15 DIAGNOSIS — M79672 Pain in left foot: Secondary | ICD-10-CM | POA: Diagnosis not present

## 2017-03-15 DIAGNOSIS — L84 Corns and callosities: Secondary | ICD-10-CM | POA: Diagnosis not present

## 2017-03-15 DIAGNOSIS — M79671 Pain in right foot: Secondary | ICD-10-CM | POA: Diagnosis not present

## 2017-03-15 DIAGNOSIS — L602 Onychogryphosis: Secondary | ICD-10-CM | POA: Diagnosis not present

## 2017-04-18 IMAGING — CT CT CHEST W/O CM
2 of 4 series · 14 of 36 positions shown, 17 images · non-contrast
Comparison: Prior radiograph from earlier the same day.

CLINICAL DATA: Initial evaluation for acute sudden onset left-sided
chest pain, intermittent in nature. Also with dry cough.

EXAM:
CT CHEST WITHOUT CONTRAST
TECHNIQUE: Multidetector CT imaging of the chest was performed following the
standard protocol without IV contrast.

[Series 2: chest w/o st · axial · non-contrast · 0.63mm/px · z∈[-247,-23]mm · 11 of 134 slices shown, 14 images]
[im 11/134  mediastinal]
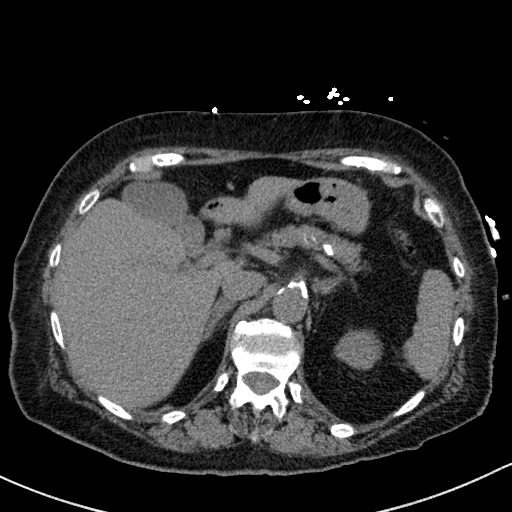
[im 11/134  lung]
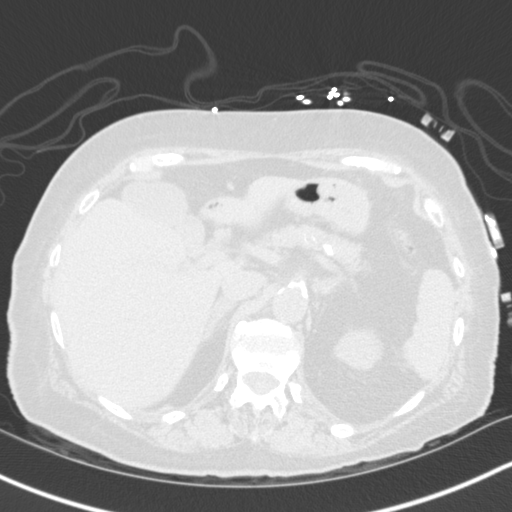
[im 21/134  lung]
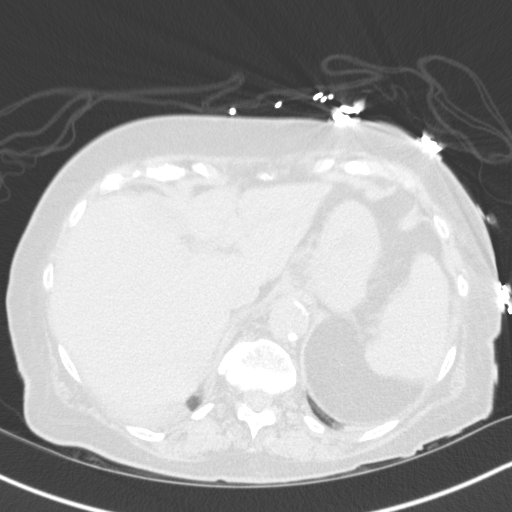
[im 31/134  lung]
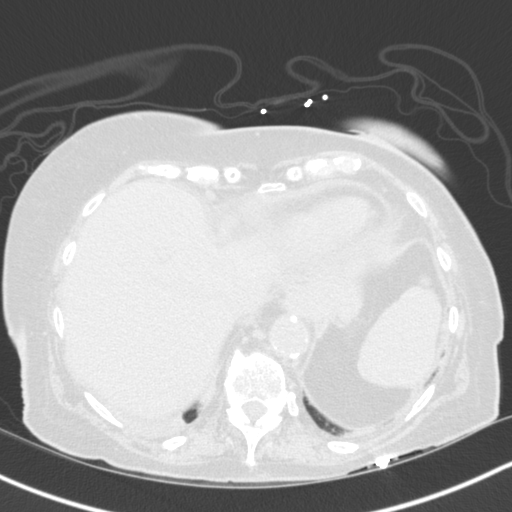
[im 41/134  lung]
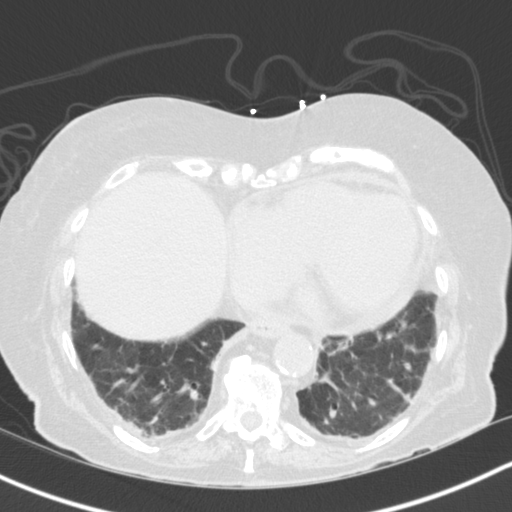
[im 52/134  mediastinal]
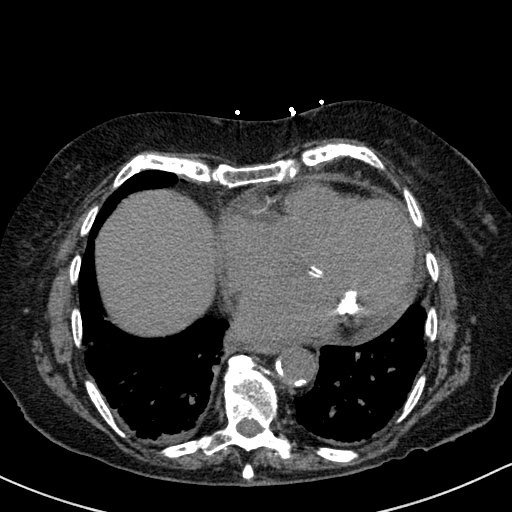
[im 52/134  lung]
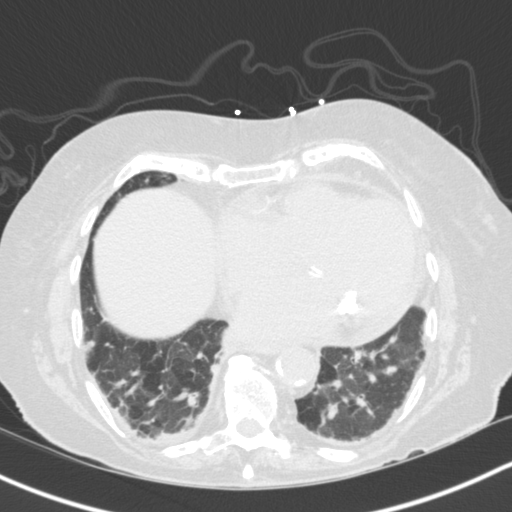
[im 72/134  lung]
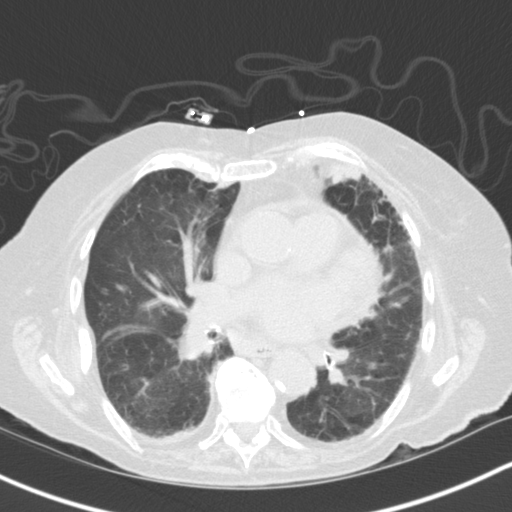
[im 82/134  lung]
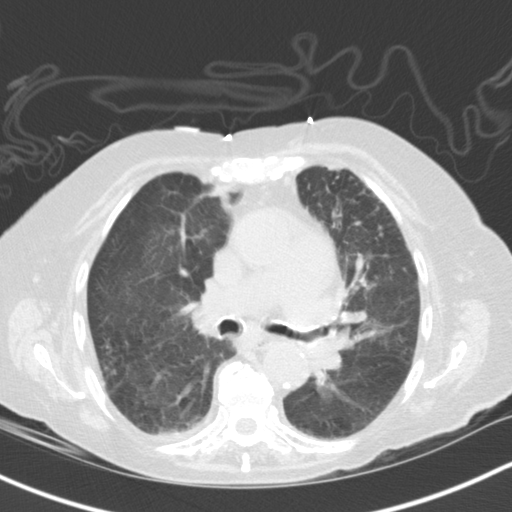
[im 93/134  lung]
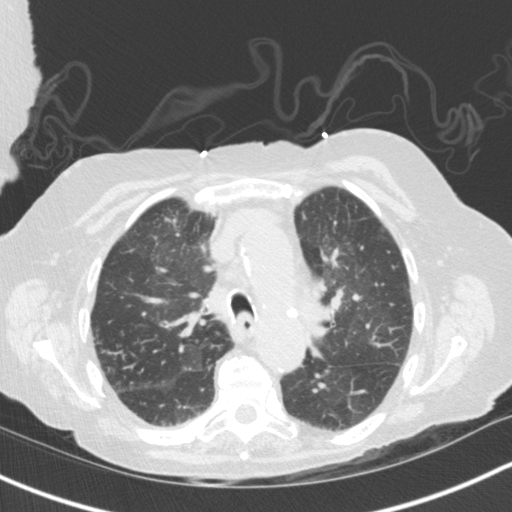
[im 103/134  mediastinal]
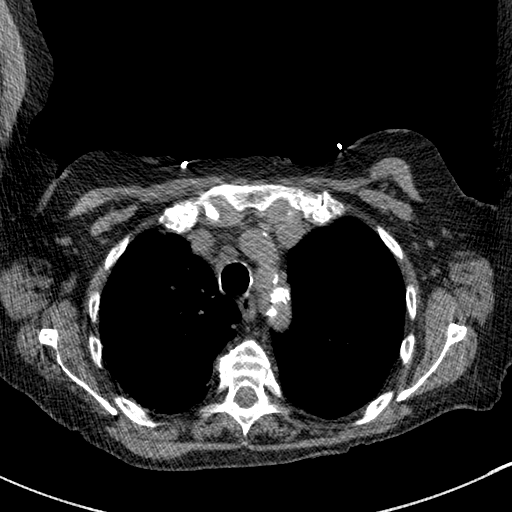
[im 103/134  lung]
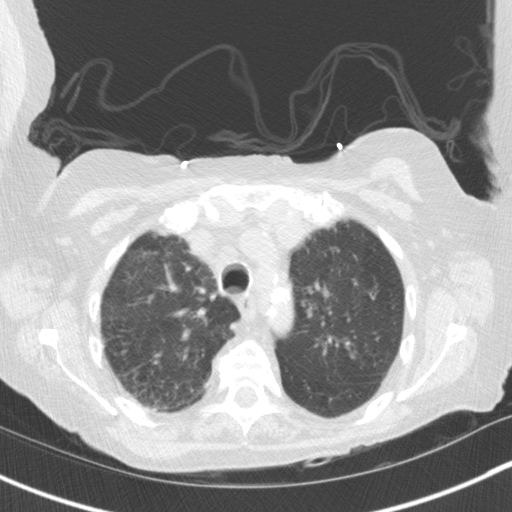
[im 113/134  lung]
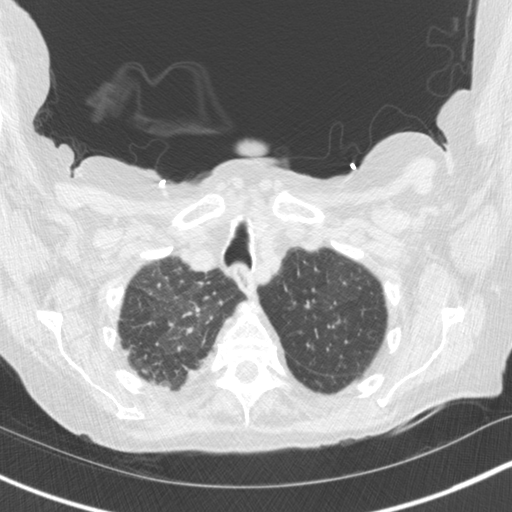
[im 123/134  lung]
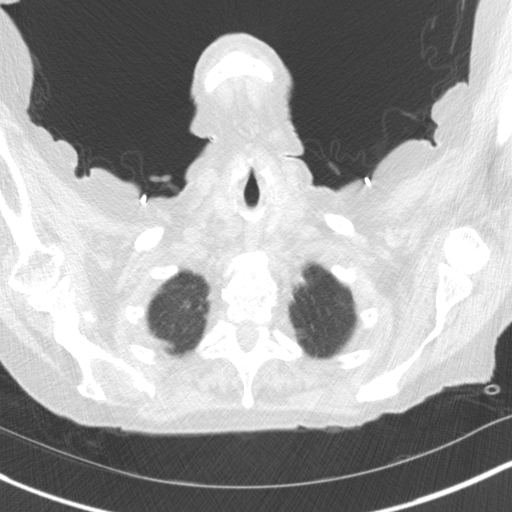

[Series 6: coronals · coronal · 0.53mm/px · 3 of 132 slices shown]
[im 27/132  lung]
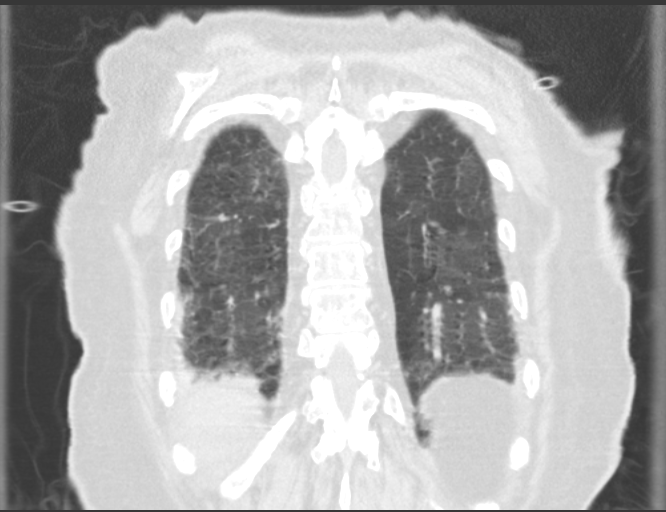
[im 53/132  lung]
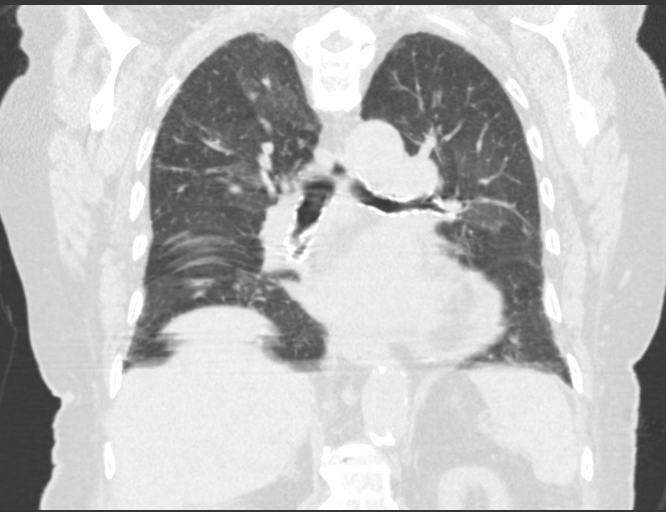
[im 79/132  lung]
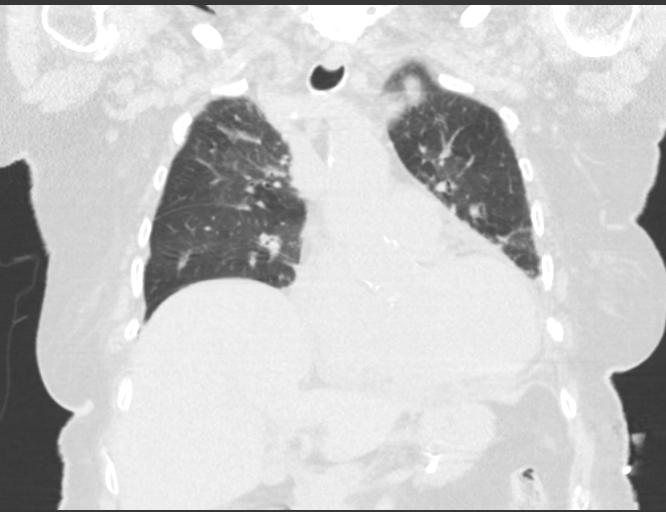

[14 of 36 positions shown; findings below may reference images not displayed]

FINDINGS: Thyroid gland within normal limits.

Mildly prominent right paratracheal node measures at the upper
limits of normal at 1 cm. Few additional scattered shotty
subcentimeter mediastinal lymph nodes present. Node definite
pathologically enlarged mediastinal, hilar, or axillary lymph nodes
identified on this noncontrast examination.

Scattered atheromatous plaque within the aortic arch in at the
origin of the great vessels. The aorta itself within normal limits
for size without evidence for aneurysm.

Moderate cardiomegaly present. Prominent aortic and mitral valvular
calcifications. Scattered coronary artery calcifications present as
well. Small to moderate pericardial effusion.

Pulmonary arteries not well evaluated on this noncontrast
examination.

Evaluation of lungs mildly limited by motion artifact. Mild
atelectatic changes present dependently within both lower lobes.
Patchy and linear opacities within the lingula also old largely
favored to reflect atelectasis. Superimposed scattered areas of
bronchiectasis present within the lingula. There are scattered
tree-in-bud nodular densities within the right upper lobe,
suspicious for possible small airways disease/endobronchial
infection. Similar but less severe densities present within the left
upper lobe as well. Superimposed bronchiectasis within the right
upper lobe. No frank consolidative airspace disease or air
bronchograms to suggest pneumonia. No significant interlobular
septal thickening to suggest pulmonary edema. No pleural effusion.
No pneumothorax. No worrisome pulmonary nodule or mass.

Visualized portions of the upper abdomen demonstrate no acute
abnormality. Small hiatal hernia noted. Prominent atheromatous
plaque within the visualized intra-abdominal aorta and its branch
vessels.

No acute osseous abnormality. Few scatter remotely healed
right-sided rib fractures noted. Fractures No worrisome lytic or
blastic osseous lesions. Accentuation the normal thoracic kyphosis
with scattered multilevel degenerative spondylolysis.
IMPRESSION: 1. Mild scattered nodular tree-in-bud opacities involving the
bilateral upper lobes, right worse than left, suspicious for
possible acute endobronchial infection/small airways disease in the
setting of cough.
2. Moderate cardiomegaly without pulmonary edema.
3. Small to moderate pericardial effusion.
4. Prominent aortic and mitral valvular calcifications, with
prominent coronary and aortic atherosclerotic disease.

## 2017-05-16 DIAGNOSIS — H401132 Primary open-angle glaucoma, bilateral, moderate stage: Secondary | ICD-10-CM | POA: Diagnosis not present

## 2017-05-16 DIAGNOSIS — H353222 Exudative age-related macular degeneration, left eye, with inactive choroidal neovascularization: Secondary | ICD-10-CM | POA: Diagnosis not present

## 2017-05-16 DIAGNOSIS — H353134 Nonexudative age-related macular degeneration, bilateral, advanced atrophic with subfoveal involvement: Secondary | ICD-10-CM | POA: Diagnosis not present

## 2017-05-17 DIAGNOSIS — L602 Onychogryphosis: Secondary | ICD-10-CM | POA: Diagnosis not present

## 2017-05-17 DIAGNOSIS — M79672 Pain in left foot: Secondary | ICD-10-CM | POA: Diagnosis not present

## 2017-05-17 DIAGNOSIS — M79671 Pain in right foot: Secondary | ICD-10-CM | POA: Diagnosis not present

## 2017-05-17 DIAGNOSIS — L84 Corns and callosities: Secondary | ICD-10-CM | POA: Diagnosis not present

## 2017-05-25 ENCOUNTER — Other Ambulatory Visit: Payer: Self-pay | Admitting: Internal Medicine

## 2017-05-25 DIAGNOSIS — I10 Essential (primary) hypertension: Secondary | ICD-10-CM

## 2017-07-22 DIAGNOSIS — M79671 Pain in right foot: Secondary | ICD-10-CM | POA: Diagnosis not present

## 2017-07-22 DIAGNOSIS — L84 Corns and callosities: Secondary | ICD-10-CM | POA: Diagnosis not present

## 2017-07-22 DIAGNOSIS — L602 Onychogryphosis: Secondary | ICD-10-CM | POA: Diagnosis not present

## 2017-07-22 DIAGNOSIS — M79672 Pain in left foot: Secondary | ICD-10-CM | POA: Diagnosis not present

## 2017-09-06 ENCOUNTER — Other Ambulatory Visit: Payer: Self-pay | Admitting: Physician Assistant

## 2017-09-06 DIAGNOSIS — K5010 Crohn's disease of large intestine without complications: Secondary | ICD-10-CM

## 2017-09-07 ENCOUNTER — Other Ambulatory Visit: Payer: Self-pay

## 2017-09-07 MED ORDER — ATENOLOL 25 MG PO TABS: 25.0000 mg | ORAL_TABLET | Freq: Every day | ORAL | 1 refills | Status: DC

## 2017-09-12 ENCOUNTER — Telehealth: Payer: Self-pay | Admitting: *Deleted

## 2017-09-12 ENCOUNTER — Encounter: Payer: Self-pay | Admitting: Adult Health

## 2017-09-12 ENCOUNTER — Non-Acute Institutional Stay (SKILLED_NURSING_FACILITY): Payer: Commercial Managed Care - HMO | Admitting: Adult Health

## 2017-09-12 DIAGNOSIS — W19XXXA Unspecified fall, initial encounter: Secondary | ICD-10-CM | POA: Diagnosis not present

## 2017-09-12 DIAGNOSIS — S40012A Contusion of left shoulder, initial encounter: Secondary | ICD-10-CM

## 2017-09-12 DIAGNOSIS — Z7189 Other specified counseling: Secondary | ICD-10-CM | POA: Diagnosis not present

## 2017-09-12 DIAGNOSIS — M25552 Pain in left hip: Secondary | ICD-10-CM | POA: Diagnosis not present

## 2017-09-12 DIAGNOSIS — S7002XA Contusion of left hip, initial encounter: Secondary | ICD-10-CM

## 2017-09-12 DIAGNOSIS — I10 Essential (primary) hypertension: Secondary | ICD-10-CM | POA: Diagnosis not present

## 2017-09-12 DIAGNOSIS — H409 Unspecified glaucoma: Secondary | ICD-10-CM

## 2017-09-12 DIAGNOSIS — K5901 Slow transit constipation: Secondary | ICD-10-CM

## 2017-09-12 DIAGNOSIS — M159 Polyosteoarthritis, unspecified: Secondary | ICD-10-CM

## 2017-09-12 DIAGNOSIS — K5010 Crohn's disease of large intestine without complications: Secondary | ICD-10-CM | POA: Diagnosis not present

## 2017-09-12 DIAGNOSIS — R404 Transient alteration of awareness: Secondary | ICD-10-CM

## 2017-09-12 DIAGNOSIS — I5032 Chronic diastolic (congestive) heart failure: Secondary | ICD-10-CM | POA: Diagnosis not present

## 2017-09-12 DIAGNOSIS — M25512 Pain in left shoulder: Secondary | ICD-10-CM | POA: Diagnosis not present

## 2017-09-12 NOTE — Progress Notes (Signed)
Provider:   Location:  Occupational psychologist of Service:  SNF (31)  PCP: Unk Pinto, MD [name], MD as PCP - General (Internal Medicine)  Extended Emergency Contact Information Primary Emergency Contact: Courtney Fuller of [place] Phone: [phone] Mobile Phone: [phone] Relation: Friend Secondary Emergency Contact: Courtney Fuller [place] of [place] Phone: [phone] Relation: Friend  Code Status: DNR Goals of Care: Advanced Directive information Advanced Directives 02/28/2017  Does Patient Have a Medical Advance Directive? Yes  Type of Advance Directive Out of facility DNR (pink MOST or yellow form)  Does patient want to make changes to medical advance directive? No - Patient declined  Copy of [place] in Chart? -  Pre-existing out of facility DNR order (yellow form or pink MOST form) -      Chief Complaint  Patient presents with  . Acute Visit    decreased LOC    HPI: Patient is a 83 y.o. female seen today for admission to [facility] due to decreased LOC. On Saturday evening 10/6 she fell and laid in the floor of her independent living apt for 13 hrs. At that point, she was placed in bed by the staff once she was found.  On Sunday 10/7 she attempted to get to the bathroom and drag her body there and was once again found on the floor (unclear if it was a fall or if she was dragging her body).  Home care had been hired to help her as she had wishes not to go the hospital. Sunday evening she ate a meal from [place] [employer] brought to her by her POA, Courtney Fuller, and drank 3 large cups of water. Sometime Sunday night 10/7 and early Monday in the morning she became non responsive. She was brought to [facility] on the morning of 10/8 at 1030.  Courtney Fuller indicates that Courtney Fuller had been in declining health with worsening knee pain, poor vision, and was afraid she would lose her  independence. She adamantly did not want to go to the hospital for any acute conditions and feared having to move to [place]. She had expressed that she was ready to pass away and did not want her life prolonged at 83 years old.   It is not clear if she hit her head or why she fell.  Prior to the events on Saturday she was ambulatory with a walker.   She did have a pill box that fell on the floor and the house keeper refilled it and it may not have been done properly. Courtney Fuller reviewed the box and assured me that she had not taken any of her cardiac medications and that there as no concern for over dose.  Courtney Fuller is extremely difficult to arouse at this point but does not appear to be in pain. Her BP had been low in the home when checked by the nurse but is now [place].  CBG 115   Past Medical History:  Diagnosis Date  . Crohn's disease ([place])   . Glaucoma   . Hyperlipidemia   . Hypertension   . Prediabetes   . Vitamin D deficiency    Past Surgical History:  Procedure Laterality Date  . TONSILLECTOMY AND ADENOIDECTOMY      reports that she has never smoked. She has never used smokeless tobacco. She reports that she does not drink alcohol. Her drug history is not on file. Social History   Social History  .  Marital status: Widowed    Spouse name: N/A  . Number of children: N/A  . Years of education: N/A   Occupational History  . Not on file.   Social History Main Topics  . Smoking status: Never Smoker  . Smokeless tobacco: Never Used  . Alcohol use No  . Drug use: Unknown  . Sexual activity: Not on file   Other Topics Concern  . Not on file   Social History Narrative  . No narrative on file    Functional Status Survey:    Family History  Problem Relation Age of Onset  . Stroke Father     Health Maintenance  Topic Date Due  . INFLUENZA VACCINE  07/06/2017  . DEXA SCAN  09/30/2017 (Originally [birth year]/17/1982)  . TETANUS/TDAP  12/13/2022  . PNA vac Low Risk Adult   Completed    Allergies  Allergen Reactions  . Ace Inhibitors Other (See Comments)    On [facility]    Outpatient Encounter Prescriptions as of 09/12/2017  Medication Sig  . amLODipine (NORVASC) 5 MG tablet TAKE 1 TABLET EACH DAY.  Marland Kitchen aspirin 81 MG chewable tablet Chew by mouth daily.  Marland Kitchen atenolol (TENORMIN) 25 MG tablet Take 1 tablet (25 mg total) by mouth daily.  . Cholecalciferol 4000 UNITS CAPS Take 2,000 Units by mouth daily.   . furosemide (LASIX) 20 MG tablet Take 1 tablet (20 mg total) by mouth daily.  Marland Kitchen latanoprost (XALATAN) 0.005 % ophthalmic solution Place 1 drop into both eyes at bedtime.   Marland Kitchen lubiprostone (AMITIZA) 24 MCG capsule Take 1 capsule (24 mcg total) by mouth 2 (two) times daily with a meal.  . Magnesium 250 MG TABS Take 1 tablet by mouth 2 (two) times daily.  . mesalamine (PENTASA) 500 MG CR capsule Take 1 capsule (500 mg total) by mouth 4 (four) times daily.  . Mesalamine 800 MG TBEC TAKE 1 TABLET TWICE DAILY.  . multivitamin-lutein (OCUVITE-LUTEIN) CAPS capsule Take 1 capsule by mouth 2 (two) times daily.  . potassium chloride (K-DUR,KLOR-CON) 10 MEQ tablet Take 1 tablet (10 mEq total) by mouth daily.  . traMADol (ULTRAM) 50 MG tablet 1 pill twice daily as needed for pain   No facility-administered encounter medications on file as of 09/12/2017.     Review of Systems  Unable to perform ROS: Acuity of condition    Vitals:   09/12/17 1117  BP: 111/61  Pulse: 79  Resp: 20  Temp: (!) 100.4 F (38 C)  SpO2: 94%   There is no height or weight on file to calculate BMI. Physical Exam  Constitutional: No distress.  HENT:  Head: Normocephalic and atraumatic.  Right Ear: External ear normal.  Left Ear: External ear normal.  Nose: Nose normal.  Mouth/Throat: No oropharyngeal exudate.  Very dry mouth, skin tenting noted  Eyes: Pupils are equal, round, and reactive to light. Conjunctivae are normal. Right eye exhibits no discharge. Left eye exhibits no discharge.    Cardiovascular: Normal rate.   No murmur heard. Irregular rate  Pulmonary/Chest: Effort normal and breath sounds normal.  Abdominal: Soft. She exhibits distension (slight).  Musculoskeletal: She exhibits no edema or tenderness.  No pain with ROM to left hip, right hip, bilateral knees, and right shoulder. Left shoulder is bruised and slightly swollen so no ROM completed  Lymphadenopathy:    She has no cervical adenopathy.  Neurological:  Lethargic, arouses to sternal rub minimally responsive  Skin: Skin is warm and dry. She is not diaphoretic.  Bruising to left hip and left shoulder. Erythema to buttocks  Nursing note and vitals reviewed.   Labs reviewed: Basic Metabolic Panel: No results for input(s): NA, K, CL, CO2, GLUCOSE, BUN, CREATININE, CALCIUM, MG, PHOS in the last 8760 hours. Liver Function Tests: No results for input(s): AST, ALT, ALKPHOS, BILITOT, PROT, ALBUMIN in the last 8760 hours. No results for input(s): LIPASE, AMYLASE in the last 8760 hours. No results for input(s): AMMONIA in the last 8760 hours. CBC: No results for input(s): WBC, NEUTROABS, HGB, HCT, MCV, PLT in the last 8760 hours. Cardiac Enzymes: No results for input(s): CKTOTAL, CKMB, CKMBINDEX, TROPONINI in the last 8760 hours. BNP: Invalid input(s): POCBNP Lab Results  Component Value Date   HGBA1C 5.5 12/19/2013   Lab Results  Component Value Date   TSH 2.299 07/22/2016   No results found for: VITAMINB12 No results found for: FOLATE No results found for: IRON, TIBC, FERRITIN  Imaging and Procedures obtained prior to SNF admission: Dg Chest 2 View  Result Date: 07/21/2016 CLINICAL DATA:  Sudden onset of right upper chest pain and shortness of breath. EXAM: CHEST  2 VIEW COMPARISON:  Two-view chest x-ray 07/24/15 FINDINGS: The heart is enlarged. There is increase in a diffuse interstitial pattern. Airspace opacities are more prominent left than right. Remote right-sided rib fractures are noted.  Multilevel degenerative changes are noted in the thoracic spine. IMPRESSION: 1. Interval increase in diffuse interstitial and airspace disease. This likely represents edema. Infection is also considered. 2. Stable cardiomegaly. Electronically Signed   By: San Morelle M.D.   On: 07/21/2016 22:22   Ct Chest Wo Contrast  Result Date: 07/22/2016 CLINICAL DATA:  Initial evaluation for acute sudden onset left-sided chest pain, intermittent in nature. Also with dry cough. EXAM: CT CHEST WITHOUT CONTRAST TECHNIQUE: Multidetector CT imaging of the chest was performed following the standard protocol without IV contrast. COMPARISON:  Prior radiograph from earlier the same day. FINDINGS: Thyroid gland within normal limits. Mildly prominent right paratracheal node measures at the upper limits of normal at 1 cm. Few additional scattered shotty subcentimeter mediastinal lymph nodes present. Node definite pathologically enlarged mediastinal, hilar, or axillary lymph nodes identified on this noncontrast examination. Scattered atheromatous plaque within the aortic arch in at the origin of the great vessels. The aorta itself within normal limits for size without evidence for aneurysm. Moderate cardiomegaly present. Prominent aortic and mitral valvular calcifications. Scattered coronary artery calcifications present as well. Small to moderate pericardial effusion. Pulmonary arteries not well evaluated on this noncontrast examination. Evaluation of lungs mildly limited by motion artifact. Mild atelectatic changes present dependently within both lower lobes. Patchy and linear opacities within the lingula also old largely favored to reflect atelectasis. Superimposed scattered areas of bronchiectasis present within the lingula. There are scattered tree-in-bud nodular densities within the right upper lobe, suspicious for possible small airways disease/endobronchial infection. Similar but less severe densities present within the  left upper lobe as well. Superimposed bronchiectasis within the right upper lobe. No frank consolidative airspace disease or air bronchograms to suggest pneumonia. No significant interlobular septal thickening to suggest pulmonary edema. No pleural effusion. No pneumothorax. No worrisome pulmonary nodule or mass. Visualized portions of the upper abdomen demonstrate no acute abnormality. Small hiatal hernia noted. Prominent atheromatous plaque within the visualized intra-abdominal aorta and its branch vessels. No acute osseous abnormality. Few scatter remotely healed right-sided rib fractures noted. Fractures No worrisome lytic or blastic osseous lesions. Accentuation the normal thoracic kyphosis with scattered multilevel degenerative spondylolysis.  IMPRESSION: 1. Mild scattered nodular tree-in-bud opacities involving the bilateral upper lobes, right worse than left, suspicious for possible acute endobronchial infection/small airways disease in the setting of cough. 2. Moderate cardiomegaly without pulmonary edema. 3. Small to moderate pericardial effusion. 4. Prominent aortic and mitral valvular calcifications, with prominent coronary and aortic atherosclerotic disease. Electronically Signed   By: Jeannine Boga M.D.   On: 07/22/2016 13:53    Assessment/Plan  1. Altered level of consciousness Unclear if this is due to the fall  She does not appear to have any outward trauma to her head. She appears dry on exam which may be contributing to her obtunded state.   2. Fall, initial encounter ? If this was due to weakness or dehydration Appears to have at the very least a contusion to the left shoulder and left hip  3. Contusion of left shoulder, initial encounter Xray left shoulder to rule out fracture and the need for a sling ICE to left shoulder 15 min TID x 72 hrs  4. Contusion of left hip, initial encounter Xray left hip 2 view to rule out fracture  5. Slow transit constipation At home she  was on amitiza, will order dulcolax supp qd prn due to the inability to swallow  6. Osteoarthritis of multiple joints, unspecified osteoarthritis type Notes indicate she had chronic joint pain and was periodically taking ultram Roxanol ordered 5 mg q 4 hrs prn pain (due to decreased LOC can not swallow)  7. Crohn's disease of large intestine without complication ([place]) Hx of this and was taking mesalamine NO issues thus far  8. Chronic diastolic heart failure (HCC) Her POA indicated tha there Lasix was decreased to twice weekly but epic did not reflect that She appears dry on exam and does not need diuresis.  9. Essential hypertension Controlled  10. Glaucoma, unspecified glaucoma type, unspecified laterality  and macular degeneration leading to worsening eye sight Her PCP notes indicate that she was no longer going to the ophthalmalogist  11. Urinary retention I/O cath for distention/retention and q 8 prn discomfort May leave foley in if necessary  12. Advanced care planning I spoke with her POA and family friend, Courtney Fuller.  Courtney Fuller did not want to be sent to the hospital if she got sick or receive any aggressive care. She had been in failing health with worsening knee pain, worsening eye sight, and was afraid of losing her independence. We discussed that her goals of care would be focused in comfort. If Courtney Fuller did recover from this acute condition of undetermined etiology she would most likely not be able to return to [place] and that would not be in line with her wishes. Courtney Fuller feels that she would not want to be treated for any conditions unless her comfort was at stake. We decided to forego any labs or IVS.  We will focus on her comfort needs such as constipation, chronic joint pain, and possible urinary retention.     Family/ staff Communication: discussed with her POA, Courtney Fuller  Labs/tests ordered: shoulder and hip xray left

## 2017-09-12 NOTE — Telephone Encounter (Signed)
Patient fell last PM and declined to go to be evaluated at the hospital.  She is not having urine output.  Per Dr Melford Aase, insert a foley catheter and no labs needed at this time, since the patient is requesting no treatment.  An order was faxed to [facility].

## 2017-09-13 ENCOUNTER — Non-Acute Institutional Stay (SKILLED_NURSING_FACILITY): Payer: Commercial Managed Care - HMO | Admitting: Internal Medicine

## 2017-09-13 ENCOUNTER — Encounter: Payer: Self-pay | Admitting: Internal Medicine

## 2017-09-13 DIAGNOSIS — S7002XD Contusion of left hip, subsequent encounter: Secondary | ICD-10-CM

## 2017-09-13 DIAGNOSIS — N183 Chronic kidney disease, stage 3 unspecified: Secondary | ICD-10-CM

## 2017-09-13 DIAGNOSIS — Z7189 Other specified counseling: Secondary | ICD-10-CM

## 2017-09-13 DIAGNOSIS — S42292A Other displaced fracture of upper end of left humerus, initial encounter for closed fracture: Secondary | ICD-10-CM | POA: Diagnosis not present

## 2017-09-13 DIAGNOSIS — M159 Polyosteoarthritis, unspecified: Secondary | ICD-10-CM | POA: Diagnosis not present

## 2017-09-13 DIAGNOSIS — K5010 Crohn's disease of large intestine without complications: Secondary | ICD-10-CM

## 2017-09-13 DIAGNOSIS — K5901 Slow transit constipation: Secondary | ICD-10-CM | POA: Diagnosis not present

## 2017-09-13 DIAGNOSIS — I10 Essential (primary) hypertension: Secondary | ICD-10-CM

## 2017-09-13 DIAGNOSIS — H409 Unspecified glaucoma: Secondary | ICD-10-CM

## 2017-09-13 DIAGNOSIS — I5032 Chronic diastolic (congestive) heart failure: Secondary | ICD-10-CM | POA: Diagnosis not present

## 2017-09-13 DIAGNOSIS — Z515 Encounter for palliative care: Secondary | ICD-10-CM

## 2017-09-13 NOTE — Progress Notes (Signed)
Patient ID: Courtney Fuller, female   DOB: 1915-10-21, 83 y.o.   MRN: [id number]  Provider:  Rexene Edison. Mariea Clonts, D.O., C.M.D. Location:  [facility] Room Number: [place] of Service:  SNF (31)  PCP: Unk Pinto, MD [name], MD as PCP - General (Internal Medicine)  Extended Emergency Contact Information Primary Emergency Contact: Alain Marion of [place] Phone: [phone] Mobile Phone: [phone] Relation: Friend Secondary Emergency Contact: Lyon,Bill  [place] Phone: [phone] Relation: Friend  Code Status: DNR, comfort care in [facility] rehab Goals of Care: Advanced Directive information Advanced Directives 09/13/2017  Does Patient Have a Medical Advance Directive? Yes  Type of Advance Directive Out of facility DNR (pink MOST or yellow form);Living will;Healthcare Power of Attorney  Does patient want to make changes to medical advance directive? -  Copy of [place] in Chart? Yes  Pre-existing out of facility DNR order (yellow form or pink MOST form) Yellow form placed in chart (order not valid for inpatient use);Pink MOST form placed in chart (order not valid for inpatient use)   Chief Complaint  Patient presents with  . New Admit To SNF    rehab admission    HPI: Patient is a 83 y.o. female seen today for admission to [facility] rehab 09/12/17.  Ms. Kliewer has a h/o HTN, Crohn's disease, hyperlipidemia,, vitamin D deficiency, glaucoma, macular degeneration, CKD3, OA and chronic diastolic CHF.  On 10/6, she sustained a fall in her independent living apt that required assistance to get her back up and she rested for the next day.  Then on 10/7, she attempted to get up on her own to use the restroom in her apt, and fell again.  She then apparently was doing much better and had dinner with her friend at [facility].  Sometime overnight 10/7-10/8, she became  unresponsive.  She had been very clear with the falls that she did not want to be taken to the ED for any reason.  She was admitted to rehab on 10/8 at 10:30am.  She was seen by NP Wert who ordered imaging studies on her injured parts from her fall including left shoulder, left hip.  Ice was ordered for the shoulder which did reveal a hairline fx of the humeral head that was nondisplaced.  Staff are being extra careful and gentle when moving her.  She has roxanol as needed for pain.  She also had been suffering from urinary retention and I/O caths were ordered, but then her PCP outside of [place] was contacted for some reason and a foley was ordered which she does have in placed now which is working well.    When seen, she remained obtunded.  She appeared to be trying to speak, but it was unclear--lips were moving and mouth staying opened.  She appeared dry.  She has refused any po intake and is getting oral care.  She was noted to have a bruise on her left buttock.  She appeared comfortable.  Her friend and POA Sheri as well as the rehab nurse reported that earlier in the day, she'd been awake and talking about all of the people she had heard in the room visiting her the previous day when she was not responding.  That was when she opted not to eat anything.    Past Medical History:  Diagnosis Date  . Crohn's disease ([place])   . Glaucoma   . Hyperlipidemia   .  Hypertension   . Prediabetes   . Vitamin D deficiency    Past Surgical History:  Procedure Laterality Date  . TONSILLECTOMY AND ADENOIDECTOMY      reports that she has never smoked. She has never used smokeless tobacco. She reports that she does not drink alcohol. Her drug history is not on file. Social History   Social History  . Marital status: Widowed    Spouse name: N/A  . Number of children: N/A  . Years of education: N/A   Occupational History  . Not on file.   Social History Main Topics  . Smoking status: Never Smoker  .  Smokeless tobacco: Never Used  . Alcohol use No  . Drug use: Unknown  . Sexual activity: Not on file   Other Topics Concern  . Not on file   Social History Narrative  . No narrative on file    Functional Status Survey:    Family History  Problem Relation Age of Onset  . Stroke Father     Health Maintenance  Topic Date Due  . INFLUENZA VACCINE  07/06/2017  . DEXA SCAN  09/30/2017 (Originally 04/17/1982)  . TETANUS/TDAP  12/13/2022  . PNA vac Low Risk Adult  Completed    Allergies  Allergen Reactions  . Ace Inhibitors Other (See Comments)    On MAR    Outpatient Encounter Prescriptions as of 09/13/2017  Medication Sig  . bisacodyl (DULCOLAX) 10 MG suppository Place 10 mg rectally daily as needed for moderate constipation.  Marland Kitchen morphine 20 MG/5ML solution Take 5 mg by mouth every 4 (four) hours as needed for pain.  . [DISCONTINUED] amLODipine (NORVASC) 5 MG tablet TAKE 1 TABLET EACH DAY.  . [DISCONTINUED] aspirin 81 MG chewable tablet Chew by mouth daily.  . [DISCONTINUED] atenolol (TENORMIN) 25 MG tablet Take 1 tablet (25 mg total) by mouth daily.  . [DISCONTINUED] Cholecalciferol 4000 UNITS CAPS Take 2,000 Units by mouth daily.   . [DISCONTINUED] furosemide (LASIX) 20 MG tablet Take 1 tablet (20 mg total) by mouth daily.  . [DISCONTINUED] latanoprost (XALATAN) 0.005 % ophthalmic solution Place 1 drop into both eyes at bedtime.   . [DISCONTINUED] lubiprostone (AMITIZA) 24 MCG capsule Take 1 capsule (24 mcg total) by mouth 2 (two) times daily with a meal.  . [DISCONTINUED] Magnesium 250 MG TABS Take 1 tablet by mouth 2 (two) times daily.  . [DISCONTINUED] mesalamine (PENTASA) 500 MG CR capsule Take 1 capsule (500 mg total) by mouth 4 (four) times daily.  . [DISCONTINUED] Mesalamine 800 MG TBEC TAKE 1 TABLET TWICE DAILY.  . [DISCONTINUED] multivitamin-lutein (OCUVITE-LUTEIN) CAPS capsule Take 1 capsule by mouth 2 (two) times daily.  . [DISCONTINUED] potassium chloride  (K-DUR,KLOR-CON) 10 MEQ tablet Take 1 tablet (10 mEq total) by mouth daily.  . [DISCONTINUED] traMADol (ULTRAM) 50 MG tablet 1 pill twice daily as needed for pain   No facility-administered encounter medications on file as of 09/13/2017.     Review of Systems  Unable to perform ROS: Patient unresponsive (any obtained from [place] (POA), bedside CNA, rehab RN)  Constitutional: Positive for activity change, appetite change and fatigue. Negative for chills and fever.  Eyes:       Very poor vision  Respiratory: Negative for shortness of breath.   Cardiovascular: Negative for leg swelling.  Gastrointestinal: Negative for constipation.  Genitourinary:       Foley in place  Neurological: Positive for weakness.  Hematological: Bruises/bleeds easily.    Vitals:   09/13/17 1109  BP: (!) 147/75  Pulse: 84  Resp: (!) 22  Temp: (!) 97.4 F (36.3 C)  TempSrc: Oral  SpO2: 94%  Height: _0  (1.549 m)   There is no height or weight on file to calculate BMI. Physical Exam  Constitutional: No distress.  HENT:  Head: Normocephalic and atraumatic.  Right Ear: External ear normal.  Left Ear: External ear normal.  Nose: Nose normal.  Mouth/Throat: Oropharynx is clear and moist.  Mouth a bit dry  Eyes: Conjunctivae are normal.  Neck: Neck supple. No JVD present.  Cardiovascular: Normal rate, regular rhythm, normal heart sounds and intact distal pulses.   Pulmonary/Chest: Effort normal and breath sounds normal.  Abdominal: Soft. Bowel sounds are normal. She exhibits no distension. There is no tenderness.  Musculoskeletal:  Unresponsive so mobility not able to be assessed  Lymphadenopathy:    She has no cervical adenopathy.  Neurological:  obtunded  Skin: Skin is warm and dry.  ecchymoses left buttock    Labs reviewed: Basic Metabolic Panel: No results for input(s): NA, K, CL, CO2, GLUCOSE, BUN, CREATININE, CALCIUM, MG, PHOS in the last 8760 hours. Liver Function Tests: No results for  input(s): AST, ALT, ALKPHOS, BILITOT, PROT, ALBUMIN in the last 8760 hours. No results for input(s): LIPASE, AMYLASE in the last 8760 hours. No results for input(s): AMMONIA in the last 8760 hours. CBC: No results for input(s): WBC, NEUTROABS, HGB, HCT, MCV, PLT in the last 8760 hours. Cardiac Enzymes: No results for input(s): CKTOTAL, CKMB, CKMBINDEX, TROPONINI in the last 8760 hours. BNP: Invalid input(s): POCBNP Lab Results  Component Value Date   HGBA1C 5.5 12/19/2013   Lab Results  Component Value Date   TSH 2.299 07/22/2016   No results found for: VITAMINB12 No results found for: FOLATE No results found for: IRON, TIBC, FERRITIN  Imaging and Procedures obtained prior to SNF admission: Dg Chest 2 View  Result Date: 07/21/2016 CLINICAL DATA:  Sudden onset of right upper chest pain and shortness of breath. EXAM: CHEST  2 VIEW COMPARISON:  Two-view chest x-ray 07/24/15 FINDINGS: The heart is enlarged. There is increase in a diffuse interstitial pattern. Airspace opacities are more prominent left than right. Remote right-sided rib fractures are noted. Multilevel degenerative changes are noted in the thoracic spine. IMPRESSION: 1. Interval increase in diffuse interstitial and airspace disease. This likely represents edema. Infection is also considered. 2. Stable cardiomegaly. Electronically Signed   By: San Morelle M.D.   On: 07/21/2016 22:22   Ct Chest Wo Contrast  Result Date: 07/22/2016 CLINICAL DATA:  Initial evaluation for acute sudden onset left-sided chest pain, intermittent in nature. Also with dry cough. EXAM: CT CHEST WITHOUT CONTRAST TECHNIQUE: Multidetector CT imaging of the chest was performed following the standard protocol without IV contrast. COMPARISON:  Prior radiograph from earlier the same day. FINDINGS: Thyroid gland within normal limits. Mildly prominent right paratracheal node measures at the upper limits of normal at 1 cm. Few additional scattered shotty  subcentimeter mediastinal lymph nodes present. Node definite pathologically enlarged mediastinal, hilar, or axillary lymph nodes identified on this noncontrast examination. Scattered atheromatous plaque within the aortic arch in at the origin of the great vessels. The aorta itself within normal limits for size without evidence for aneurysm. Moderate cardiomegaly present. Prominent aortic and mitral valvular calcifications. Scattered coronary artery calcifications present as well. Small to moderate pericardial effusion. Pulmonary arteries not well evaluated on this noncontrast examination. Evaluation of lungs mildly limited by motion artifact. Mild atelectatic changes present  dependently within both lower lobes. Patchy and linear opacities within the lingula also old largely favored to reflect atelectasis. Superimposed scattered areas of bronchiectasis present within the lingula. There are scattered tree-in-bud nodular densities within the right upper lobe, suspicious for possible small airways disease/endobronchial infection. Similar but less severe densities present within the left upper lobe as well. Superimposed bronchiectasis within the right upper lobe. No frank consolidative airspace disease or air bronchograms to suggest pneumonia. No significant interlobular septal thickening to suggest pulmonary edema. No pleural effusion. No pneumothorax. No worrisome pulmonary nodule or mass. Visualized portions of the upper abdomen demonstrate no acute abnormality. Small hiatal hernia noted. Prominent atheromatous plaque within the visualized intra-abdominal aorta and its branch vessels. No acute osseous abnormality. Few scatter remotely healed right-sided rib fractures noted. Fractures No worrisome lytic or blastic osseous lesions. Accentuation the normal thoracic kyphosis with scattered multilevel degenerative spondylolysis. IMPRESSION: 1. Mild scattered nodular tree-in-bud opacities involving the bilateral upper  lobes, right worse than left, suspicious for possible acute endobronchial infection/small airways disease in the setting of cough. 2. Moderate cardiomegaly without pulmonary edema. 3. Small to moderate pericardial effusion. 4. Prominent aortic and mitral valvular calcifications, with prominent coronary and aortic atherosclerotic disease. Electronically Signed   By: Jeannine Boga M.D.   On: 07/22/2016 13:53   At SNF admission: Left shoulder xrays with hairline fx humeral head, nondisplaced Left hip xray negative for acute findings  Assessment/Plan 1. End of life care -pt appears to be actively dying -she has chosen not to eat or drink even when alert earlier this am -she is again obtunded at this time--?head injury during falls or cardiac arrhythmia causing falls -pt wants no workup, purely comfort measures, no hospitalization, no IVF or abx -met with Barbera Setters who confirmed pt's wishes again today and provided her with support  2. Humeral head fracture, left, closed, initial encounter -continue roxanol and tylenol as needed for pain  3. Contusion of left hip, subsequent encounter -again, roxanol and tylenol as needed for pain  4. Slow transit constipation -bisacodyl supp as needed  5. Osteoarthritis of multiple joints, unspecified osteoarthritis type -cont roxanol and tylenol as needed for pain  6. Crohn's disease of large intestine without complication ([place]) -no active symptoms  7. Chronic diastolic heart failure (HCC) -no evidence of exacerbation, but likely would not tolerate IVFs if she did want them due to her chf  8. Essential hypertension -bp has been fair, would not treat due to goals being comfort at end of life  9. Glaucoma, unspecified glaucoma type, unspecified laterality -vision very poor and interfering with good qol at this time  10. Advanced care planning/counseling discussion -met with her friend and POA, Barbera Setters, for 18 mins, who reiterates patient's wishes  for comfort care, no hospitalizations, no life-prolonging interventions--we reviewed events from earlier when pt was alert and talking again as a stage in the dying process often seen, offered emotional support  11. CKD (chronic kidney disease) stage 3, GFR 30-59 ml/min (HCC) -cont comfort care, no labs  Family/ staff Communication: discussed with Barbera Setters, friend and POA; SNF RN and bedside CNA  Labs/tests ordered:  NONE per goals of care, comfort measures  Havah Ammon L. Esco Joslyn, D.O. [facility] [address], [place] [address] Cell Phone (Mon-Fri 8am-5pm):  [phone] On Call:  [phone] & follow prompts after 5pm & weekends Office Phone:  [phone] Office Fax:  [phone]

## 2017-09-29 DIAGNOSIS — M6281 Muscle weakness (generalized): Secondary | ICD-10-CM | POA: Diagnosis not present

## 2017-09-29 DIAGNOSIS — R2689 Other abnormalities of gait and mobility: Secondary | ICD-10-CM | POA: Diagnosis not present

## 2017-09-29 DIAGNOSIS — Z9181 History of falling: Secondary | ICD-10-CM | POA: Diagnosis not present

## 2017-11-05 DEATH — deceased

## 2018-01-05 ENCOUNTER — Encounter: Payer: Self-pay | Admitting: Physician Assistant

## 2018-01-16 ENCOUNTER — Encounter: Payer: Self-pay | Admitting: Physician Assistant
